# Patient Record
Sex: Female | Born: 1990 | Race: White | Hispanic: No | Marital: Married | Smoking: Never smoker
Health system: Southern US, Community
[De-identification: ages and names within clinical notes are randomized; demographics above are authoritative.]

## PROBLEM LIST (undated history)

## (undated) DIAGNOSIS — T4145XA Adverse effect of unspecified anesthetic, initial encounter: Secondary | ICD-10-CM

## (undated) DIAGNOSIS — N979 Female infertility, unspecified: Secondary | ICD-10-CM

## (undated) DIAGNOSIS — T8859XA Other complications of anesthesia, initial encounter: Secondary | ICD-10-CM

## (undated) DIAGNOSIS — F419 Anxiety disorder, unspecified: Secondary | ICD-10-CM

## (undated) DIAGNOSIS — Z9889 Other specified postprocedural states: Secondary | ICD-10-CM

## (undated) DIAGNOSIS — R112 Nausea with vomiting, unspecified: Secondary | ICD-10-CM

## (undated) HISTORY — DX: Female infertility, unspecified: N97.9

## (undated) HISTORY — PX: TONSILLECTOMY: SUR1361

## (undated) HISTORY — DX: Other complications of anesthesia, initial encounter: T88.59XA

## (undated) HISTORY — DX: Other specified postprocedural states: Z98.890

## (undated) HISTORY — PX: STRABISMUS SURGERY: SHX218

## (undated) HISTORY — PX: BREAST SURGERY: SHX581

## (undated) HISTORY — PX: RHINOPLASTY: SUR1284

## (undated) HISTORY — DX: Nausea with vomiting, unspecified: R11.2

## (undated) HISTORY — DX: Anxiety disorder, unspecified: F41.9

## (undated) HISTORY — DX: Adverse effect of unspecified anesthetic, initial encounter: T41.45XA

---

## 2014-03-22 ENCOUNTER — Other Ambulatory Visit: Payer: Self-pay | Admitting: Obstetrics and Gynecology

## 2014-03-22 DIAGNOSIS — N6320 Unspecified lump in the left breast, unspecified quadrant: Secondary | ICD-10-CM

## 2014-03-22 DIAGNOSIS — Z9882 Breast implant status: Secondary | ICD-10-CM

## 2014-03-27 ENCOUNTER — Ambulatory Visit
Admission: RE | Admit: 2014-03-27 | Discharge: 2014-03-27 | Disposition: A | Discharge status: Home / Self Care | Payer: BC Managed Care – PPO | Source: Ambulatory Visit | Attending: Obstetrics and Gynecology | Admitting: Obstetrics and Gynecology

## 2014-03-27 DIAGNOSIS — N6320 Unspecified lump in the left breast, unspecified quadrant: Secondary | ICD-10-CM

## 2014-03-27 DIAGNOSIS — Z9882 Breast implant status: Secondary | ICD-10-CM

## 2015-01-27 ENCOUNTER — Other Ambulatory Visit: Payer: Self-pay | Admitting: Obstetrics and Gynecology

## 2015-01-27 DIAGNOSIS — N6320 Unspecified lump in the left breast, unspecified quadrant: Secondary | ICD-10-CM

## 2015-01-30 ENCOUNTER — Ambulatory Visit
Admission: RE | Admit: 2015-01-30 | Discharge: 2015-01-30 | Disposition: A | Discharge status: Home / Self Care | Payer: BLUE CROSS/BLUE SHIELD | Source: Ambulatory Visit | Attending: Obstetrics and Gynecology | Admitting: Obstetrics and Gynecology

## 2015-01-30 DIAGNOSIS — N6320 Unspecified lump in the left breast, unspecified quadrant: Secondary | ICD-10-CM

## 2016-12-09 LAB — OB RESULTS CONSOLE GC/CHLAMYDIA
CHLAMYDIA, DNA PROBE: NEGATIVE
Gonorrhea: NEGATIVE

## 2016-12-09 LAB — OB RESULTS CONSOLE HEPATITIS B SURFACE ANTIGEN: Hepatitis B Surface Ag: NEGATIVE

## 2016-12-09 LAB — OB RESULTS CONSOLE RUBELLA ANTIBODY, IGM: Rubella: IMMUNE

## 2016-12-09 LAB — OB RESULTS CONSOLE ABO/RH: RH Type: POSITIVE

## 2016-12-09 LAB — OB RESULTS CONSOLE ANTIBODY SCREEN: ANTIBODY SCREEN: NEGATIVE

## 2016-12-09 LAB — OB RESULTS CONSOLE RPR: RPR: NONREACTIVE

## 2016-12-09 LAB — OB RESULTS CONSOLE HIV ANTIBODY (ROUTINE TESTING): HIV: NONREACTIVE

## 2016-12-28 DIAGNOSIS — Z36 Encounter for antenatal screening for chromosomal anomalies: Secondary | ICD-10-CM | POA: Diagnosis not present

## 2016-12-28 DIAGNOSIS — Z3491 Encounter for supervision of normal pregnancy, unspecified, first trimester: Secondary | ICD-10-CM | POA: Diagnosis not present

## 2016-12-28 DIAGNOSIS — Z13228 Encounter for screening for other metabolic disorders: Secondary | ICD-10-CM | POA: Diagnosis not present

## 2016-12-28 DIAGNOSIS — Z3682 Encounter for antenatal screening for nuchal translucency: Secondary | ICD-10-CM | POA: Diagnosis not present

## 2016-12-29 DIAGNOSIS — F411 Generalized anxiety disorder: Secondary | ICD-10-CM | POA: Diagnosis not present

## 2017-01-17 DIAGNOSIS — Z3480 Encounter for supervision of other normal pregnancy, unspecified trimester: Secondary | ICD-10-CM | POA: Diagnosis not present

## 2017-01-17 DIAGNOSIS — Z3A15 15 weeks gestation of pregnancy: Secondary | ICD-10-CM | POA: Diagnosis not present

## 2017-01-19 DIAGNOSIS — F411 Generalized anxiety disorder: Secondary | ICD-10-CM | POA: Diagnosis not present

## 2017-02-04 DIAGNOSIS — F411 Generalized anxiety disorder: Secondary | ICD-10-CM | POA: Diagnosis not present

## 2017-02-11 DIAGNOSIS — Z363 Encounter for antenatal screening for malformations: Secondary | ICD-10-CM | POA: Diagnosis not present

## 2017-03-02 DIAGNOSIS — F411 Generalized anxiety disorder: Secondary | ICD-10-CM | POA: Diagnosis not present

## 2017-04-12 DIAGNOSIS — F411 Generalized anxiety disorder: Secondary | ICD-10-CM | POA: Diagnosis not present

## 2017-04-13 DIAGNOSIS — Z3480 Encounter for supervision of other normal pregnancy, unspecified trimester: Secondary | ICD-10-CM | POA: Diagnosis not present

## 2017-04-13 DIAGNOSIS — Z23 Encounter for immunization: Secondary | ICD-10-CM | POA: Diagnosis not present

## 2017-04-19 DIAGNOSIS — F411 Generalized anxiety disorder: Secondary | ICD-10-CM | POA: Diagnosis not present

## 2017-04-26 DIAGNOSIS — F411 Generalized anxiety disorder: Secondary | ICD-10-CM | POA: Diagnosis not present

## 2017-05-06 DIAGNOSIS — Z3A30 30 weeks gestation of pregnancy: Secondary | ICD-10-CM | POA: Diagnosis not present

## 2017-05-06 DIAGNOSIS — O358XX0 Maternal care for other (suspected) fetal abnormality and damage, not applicable or unspecified: Secondary | ICD-10-CM | POA: Diagnosis not present

## 2017-05-09 DIAGNOSIS — F411 Generalized anxiety disorder: Secondary | ICD-10-CM | POA: Diagnosis not present

## 2017-06-07 DIAGNOSIS — Z3480 Encounter for supervision of other normal pregnancy, unspecified trimester: Secondary | ICD-10-CM | POA: Diagnosis not present

## 2017-06-07 DIAGNOSIS — Z3A35 35 weeks gestation of pregnancy: Secondary | ICD-10-CM | POA: Diagnosis not present

## 2017-06-13 DIAGNOSIS — Z3A36 36 weeks gestation of pregnancy: Secondary | ICD-10-CM | POA: Diagnosis not present

## 2017-06-13 DIAGNOSIS — O3663X0 Maternal care for excessive fetal growth, third trimester, not applicable or unspecified: Secondary | ICD-10-CM | POA: Diagnosis not present

## 2017-06-23 ENCOUNTER — Telehealth (HOSPITAL_COMMUNITY): Payer: Self-pay | Admitting: *Deleted

## 2017-06-23 ENCOUNTER — Encounter (HOSPITAL_COMMUNITY): Payer: Self-pay | Admitting: *Deleted

## 2017-06-23 LAB — OB RESULTS CONSOLE GBS: GBS: NEGATIVE

## 2017-06-23 NOTE — Telephone Encounter (Signed)
Preadmission screen  

## 2017-06-30 ENCOUNTER — Other Ambulatory Visit (HOSPITAL_COMMUNITY): Admit: 2017-06-30 | Payer: Self-pay

## 2017-06-30 ENCOUNTER — Inpatient Hospital Stay (HOSPITAL_COMMUNITY)
Admission: AD | Admit: 2017-06-30 | Discharge: 2017-06-30 | Disposition: A | Discharge status: Home / Self Care | Payer: BLUE CROSS/BLUE SHIELD | Source: Ambulatory Visit | Attending: Obstetrics and Gynecology | Admitting: Obstetrics and Gynecology

## 2017-06-30 ENCOUNTER — Encounter (HOSPITAL_COMMUNITY): Payer: Self-pay

## 2017-06-30 ENCOUNTER — Encounter (HOSPITAL_COMMUNITY)
Admission: RE | Admit: 2017-06-30 | Discharge: 2017-06-30 | Disposition: A | Discharge status: Home / Self Care | Payer: BLUE CROSS/BLUE SHIELD | Source: Ambulatory Visit | Attending: Obstetrics and Gynecology | Admitting: Obstetrics and Gynecology

## 2017-06-30 DIAGNOSIS — N9971 Accidental puncture and laceration of a genitourinary system organ or structure during a genitourinary system procedure: Secondary | ICD-10-CM | POA: Diagnosis not present

## 2017-06-30 DIAGNOSIS — Y838 Other surgical procedures as the cause of abnormal reaction of the patient, or of later complication, without mention of misadventure at the time of the procedure: Secondary | ICD-10-CM | POA: Diagnosis not present

## 2017-06-30 DIAGNOSIS — Y92234 Operating room of hospital as the place of occurrence of the external cause: Secondary | ICD-10-CM | POA: Diagnosis not present

## 2017-06-30 DIAGNOSIS — O479 False labor, unspecified: Secondary | ICD-10-CM | POA: Diagnosis not present

## 2017-06-30 DIAGNOSIS — Z3A39 39 weeks gestation of pregnancy: Secondary | ICD-10-CM | POA: Diagnosis not present

## 2017-06-30 DIAGNOSIS — Z823 Family history of stroke: Secondary | ICD-10-CM | POA: Diagnosis not present

## 2017-06-30 DIAGNOSIS — Z3A00 Weeks of gestation of pregnancy not specified: Secondary | ICD-10-CM | POA: Insufficient documentation

## 2017-06-30 DIAGNOSIS — O9902 Anemia complicating childbirth: Secondary | ICD-10-CM | POA: Diagnosis not present

## 2017-06-30 DIAGNOSIS — D62 Acute posthemorrhagic anemia: Secondary | ICD-10-CM | POA: Diagnosis not present

## 2017-06-30 DIAGNOSIS — Z8249 Family history of ischemic heart disease and other diseases of the circulatory system: Secondary | ICD-10-CM | POA: Diagnosis not present

## 2017-06-30 NOTE — MAU Note (Signed)
Pt presents to MAU with contractions that started at 5 pm that are occurring every 15 min. Pt denies LOF and vaginal bleeding. + FM

## 2017-06-30 NOTE — MAU Note (Signed)
I have communicated with Dr. Elon SpannerLeger and reviewed vital signs:  Vitals:   06/30/17 2120  BP: 108/62  Pulse: 82  Resp: 18  Temp: 99.3 F (37.4 C)    Vaginal exam:  Dilation: 4.5 Effacement (%): 80 Cervical Position: Middle Station: -3 Presentation: Vertex Exam by:: [place]Brittany Brizeida Mcmurry, RN,   Also reviewed contraction pattern and that non-stress test is reactive.  It has been documented that patient is contracting every 15-20 minutes with no cervical change over since office visit on Tuesday 06/28/2017, not indicating active labor.  Patient denies any other complaints.  Based on this report provider has given order for discharge.  A discharge order and diagnosis entered by a provider.   Labor discharge instructions reviewed with patient.

## 2017-07-03 ENCOUNTER — Inpatient Hospital Stay (HOSPITAL_COMMUNITY): Payer: BLUE CROSS/BLUE SHIELD | Admitting: Anesthesiology

## 2017-07-03 ENCOUNTER — Inpatient Hospital Stay (HOSPITAL_COMMUNITY)
Admission: AD | Admit: 2017-07-03 | Discharge: 2017-07-07 | DRG: 765 | Disposition: A | Discharge status: Home / Self Care | Payer: BLUE CROSS/BLUE SHIELD | Source: Ambulatory Visit | Attending: Obstetrics and Gynecology | Admitting: Obstetrics and Gynecology

## 2017-07-03 ENCOUNTER — Encounter (HOSPITAL_COMMUNITY): Payer: Self-pay

## 2017-07-03 DIAGNOSIS — D62 Acute posthemorrhagic anemia: Secondary | ICD-10-CM | POA: Diagnosis not present

## 2017-07-03 DIAGNOSIS — Z8249 Family history of ischemic heart disease and other diseases of the circulatory system: Secondary | ICD-10-CM | POA: Diagnosis not present

## 2017-07-03 DIAGNOSIS — N9971 Accidental puncture and laceration of a genitourinary system organ or structure during a genitourinary system procedure: Secondary | ICD-10-CM | POA: Diagnosis not present

## 2017-07-03 DIAGNOSIS — Y838 Other surgical procedures as the cause of abnormal reaction of the patient, or of later complication, without mention of misadventure at the time of the procedure: Secondary | ICD-10-CM | POA: Diagnosis not present

## 2017-07-03 DIAGNOSIS — Z3A39 39 weeks gestation of pregnancy: Secondary | ICD-10-CM | POA: Diagnosis not present

## 2017-07-03 DIAGNOSIS — O9902 Anemia complicating childbirth: Secondary | ICD-10-CM | POA: Diagnosis not present

## 2017-07-03 DIAGNOSIS — Z823 Family history of stroke: Secondary | ICD-10-CM | POA: Diagnosis not present

## 2017-07-03 DIAGNOSIS — Z3490 Encounter for supervision of normal pregnancy, unspecified, unspecified trimester: Secondary | ICD-10-CM

## 2017-07-03 DIAGNOSIS — Y92234 Operating room of hospital as the place of occurrence of the external cause: Secondary | ICD-10-CM | POA: Diagnosis not present

## 2017-07-03 DIAGNOSIS — Z3A38 38 weeks gestation of pregnancy: Secondary | ICD-10-CM

## 2017-07-03 LAB — CBC
HEMATOCRIT: 36.4 % (ref 36.0–46.0)
Hemoglobin: 12.5 g/dL (ref 12.0–15.0)
MCH: 31.4 pg (ref 26.0–34.0)
MCHC: 34.3 g/dL (ref 30.0–36.0)
MCV: 91.5 fL (ref 78.0–100.0)
PLATELETS: 149 10*3/uL — AB (ref 150–400)
RBC: 3.98 MIL/uL (ref 3.87–5.11)
RDW: 13.1 % (ref 11.5–15.5)
WBC: 7.2 10*3/uL (ref 4.0–10.5)

## 2017-07-03 MED ORDER — FENTANYL 2.5 MCG/ML BUPIVACAINE 1/10 % EPIDURAL INFUSION (WH - ANES)
INTRAMUSCULAR | Status: AC
  Filled 2017-07-03: qty 100

## 2017-07-03 MED ORDER — FENTANYL CITRATE (PF) 100 MCG/2ML IJ SOLN: 50.0000 ug | INTRAMUSCULAR | Status: DC | PRN

## 2017-07-03 MED ORDER — LIDOCAINE HCL (PF) 1 % IJ SOLN
30.0000 mL | INTRAMUSCULAR | Status: DC | PRN
  Filled 2017-07-03: qty 30

## 2017-07-03 MED ORDER — EPHEDRINE 5 MG/ML INJ: 10.0000 mg | INTRAVENOUS | Status: DC | PRN

## 2017-07-03 MED ORDER — FLEET ENEMA 7-19 GM/118ML RE ENEM: 1.0000 | ENEMA | RECTAL | Status: DC | PRN

## 2017-07-03 MED ORDER — LIDOCAINE HCL (PF) 1 % IJ SOLN
INTRAMUSCULAR | Status: DC | PRN
  Administered 2017-07-03: 3 mL via EPIDURAL
  Administered 2017-07-03: 4 mL via EPIDURAL

## 2017-07-03 MED ORDER — LACTATED RINGERS IV SOLN
INTRAVENOUS | Status: DC
  Administered 2017-07-03 – 2017-07-04 (×5): via INTRAVENOUS

## 2017-07-03 MED ORDER — FENTANYL 2.5 MCG/ML BUPIVACAINE 1/10 % EPIDURAL INFUSION (WH - ANES)
14.0000 mL/h | INTRAMUSCULAR | Status: DC | PRN
  Administered 2017-07-03: 14 mL/h via EPIDURAL

## 2017-07-03 MED ORDER — TERBUTALINE SULFATE 1 MG/ML IJ SOLN: 0.2500 mg | Freq: Once | INTRAMUSCULAR | Status: DC | PRN

## 2017-07-03 MED ORDER — PHENYLEPHRINE 40 MCG/ML (10ML) SYRINGE FOR IV PUSH (FOR BLOOD PRESSURE SUPPORT): 80.0000 ug | PREFILLED_SYRINGE | INTRAVENOUS | Status: DC | PRN

## 2017-07-03 MED ORDER — OXYTOCIN 40 UNITS IN LACTATED RINGERS INFUSION - SIMPLE MED
2.5000 [IU]/h | INTRAVENOUS | Status: DC
  Filled 2017-07-03: qty 1000

## 2017-07-03 MED ORDER — PHENYLEPHRINE 40 MCG/ML (10ML) SYRINGE FOR IV PUSH (FOR BLOOD PRESSURE SUPPORT)
PREFILLED_SYRINGE | INTRAVENOUS | Status: AC
  Filled 2017-07-03: qty 20

## 2017-07-03 MED ORDER — LACTATED RINGERS IV SOLN: 500.0000 mL | Freq: Once | INTRAVENOUS | Status: DC

## 2017-07-03 MED ORDER — OXYCODONE-ACETAMINOPHEN 5-325 MG PO TABS: 2.0000 | ORAL_TABLET | ORAL | Status: DC | PRN

## 2017-07-03 MED ORDER — OXYTOCIN BOLUS FROM INFUSION: 500.0000 mL | Freq: Once | INTRAVENOUS | Status: DC

## 2017-07-03 MED ORDER — OXYCODONE-ACETAMINOPHEN 5-325 MG PO TABS: 1.0000 | ORAL_TABLET | ORAL | Status: DC | PRN

## 2017-07-03 MED ORDER — OXYTOCIN 40 UNITS IN LACTATED RINGERS INFUSION - SIMPLE MED: 1.0000 m[IU]/min | INTRAVENOUS | Status: DC

## 2017-07-03 MED ORDER — ONDANSETRON HCL 4 MG/2ML IJ SOLN: 4.0000 mg | Freq: Four times a day (QID) | INTRAMUSCULAR | Status: DC | PRN

## 2017-07-03 MED ORDER — DIPHENHYDRAMINE HCL 50 MG/ML IJ SOLN: 12.5000 mg | INTRAMUSCULAR | Status: DC | PRN

## 2017-07-03 MED ORDER — ACETAMINOPHEN 325 MG PO TABS: 650.0000 mg | ORAL_TABLET | ORAL | Status: DC | PRN

## 2017-07-03 MED ORDER — LACTATED RINGERS IV SOLN: 500.0000 mL | INTRAVENOUS | Status: DC | PRN

## 2017-07-03 MED ORDER — SOD CITRATE-CITRIC ACID 500-334 MG/5ML PO SOLN
30.0000 mL | ORAL | Status: DC | PRN
Start: 2017-07-03 — End: 2017-07-04
  Administered 2017-07-04: 30 mL via ORAL
  Filled 2017-07-03: qty 15

## 2017-07-03 NOTE — Anesthesia Preprocedure Evaluation (Deleted)
Anesthesia Evaluation  Patient identified by MRN, date of birth, ID band Patient awake    Reviewed: Allergy & Precautions, Patient's Chart, lab work & pertinent test results  History of Anesthesia Complications (+) PONV and history of anesthetic complications  Airway Mallampati: II  TM Distance: >3 FB Neck ROM: Full    Dental no notable dental hx. (+) Teeth Intact   Pulmonary neg pulmonary ROS,    Pulmonary exam normal breath sounds clear to auscultation       Cardiovascular negative cardio ROS Normal cardiovascular exam Rhythm:Regular Rate:Normal     Neuro/Psych Anxiety negative neurological ROS     GI/Hepatic Neg liver ROS, GERD  ,  Endo/Other  negative endocrine ROS  Renal/GU negative Renal ROS  negative genitourinary   Musculoskeletal negative musculoskeletal ROS (+)   Abdominal   Peds  Hematology negative hematology ROS (+)   Anesthesia Other Findings   Reproductive/Obstetrics (+) Pregnancy                             Anesthesia Physical Anesthesia Plan  ASA: II  Anesthesia Plan: Epidural   Post-op Pain Management:    Induction:   PONV Risk Score and Plan:   Airway Management Planned: Natural Airway  Additional Equipment:   Intra-op Plan:   Post-operative Plan:   Informed Consent: I have reviewed the patients History and Physical, chart, labs and discussed the procedure including the risks, benefits and alternatives for the proposed anesthesia with the patient or authorized representative who has indicated his/her understanding and acceptance.     Plan Discussed with: Anesthesiologist  Anesthesia Plan Comments:         Anesthesia Quick Evaluation

## 2017-07-03 NOTE — Progress Notes (Addendum)
G1 @ 39.0. Presents to triage for r/o labor. EFM applies and VS done by RN tech.   SVE: 7/90/-2. Labs and IV done. Pt to birthing via bed.   2159: Birthing suite called to scan LR for MAU nurse due to urgency of pt needing get to  to birthing suite and desire for epidural. .

## 2017-07-03 NOTE — Anesthesia Procedure Notes (Signed)
Epidural Patient location during procedure: OB Start time: 07/03/2017 10:27 PM  Staffing Anesthesiologist: Mal AmabileFOSTER, Tyler Robidoux Performed: anesthesiologist   Preanesthetic Checklist Completed: patient identified, site marked, surgical consent, pre-op evaluation, timeout performed, IV checked, risks and benefits discussed and monitors and equipment checked  Epidural Patient position: sitting Prep: site prepped and draped and DuraPrep Patient monitoring: continuous pulse ox and blood pressure Approach: midline Location: L3-L4 Injection technique: LOR air  Needle:  Needle type: Tuohy  Needle gauge: 17 G Needle length: 9 cm and 9 Needle insertion depth: 5 cm cm Catheter type: closed end flexible Catheter size: 19 Gauge Catheter at skin depth: 10 cm Test dose: negative and Other  Assessment Events: blood not aspirated, injection not painful, no injection resistance, negative IV test and no paresthesia  Additional Notes Patient identified. Risks and benefits discussed including failed block, incomplete  Pain control, post dural puncture headache, nerve damage, paralysis, blood pressure Changes, nausea, vomiting, reactions to medications-both toxic and allergic and post Partum back pain. All questions were answered. Patient expressed understanding and wished to proceed. Sterile technique was used throughout procedure. Epidural site was Dressed with sterile barrier dressing. No paresthesias, signs of intravascular injection Or signs of intrathecal spread were encountered.  Patient was more comfortable after the epidural was dosed. Please see RN's note for documentation of vital signs and FHR which are stable.

## 2017-07-03 NOTE — H&P (Signed)
Allison Mcdowell is a 26 y.o. female presenting for active labor.  Pregnancy uncomplicated.  GBS-. OB History    Gravida Para Term Preterm AB Living   1             SAB TAB Ectopic Multiple Live Births                 Past Medical History:  Diagnosis Date  . Anxiety   . Complication of anesthesia   . Infertility, female   . PONV (postoperative nausea and vomiting)    Past Surgical History:  Procedure Laterality Date  . BREAST SURGERY     aug  . RHINOPLASTY    . STRABISMUS SURGERY    . TONSILLECTOMY     Family History: family history includes Heart disease in her maternal grandfather; Hypertension in her father, paternal grandfather, and paternal grandmother; Stroke in her paternal grandfather; Thyroid disease in her maternal aunt, maternal grandmother, and mother. Social History:  reports that she has never smoked. She has never used smokeless tobacco. She reports that she does not drink alcohol or use drugs.     Maternal Diabetes: No Genetic Screening: Normal Maternal Ultrasounds/Referrals: Normal Fetal Ultrasounds or other Referrals:  None Maternal Substance Abuse:  No Significant Maternal Medications:  None Significant Maternal Lab Results:  None Other Comments:  None  ROS History Dilation: 7 Effacement (%): 100 Station: -2 Exam by:: Dr. Rana SnareLowe Blood pressure (!) 95/59, pulse 78, temperature 98.8 F (37.1 C), resp. rate 18, height 5\' 3"  (1.6 m), weight 147 lb (66.7 kg), SpO2 100 %. Exam Physical Exam  Prenatal labs: ABO, Rh: --/--/B POS (09/09 2130) Antibody: NEG (09/09 2130) Rubella: Immune (02/15 0000) RPR: Nonreactive (02/15 0000)  HBsAg: Negative (02/15 0000)  HIV: Non-reactive (02/15 0000)  GBS: Negative (08/30 0000)   Assessment/Plan: IUP at term Active labor AROM and anticipate SVD   Allison Mcdowell C 07/03/2017, 11:31 PM

## 2017-07-03 NOTE — Anesthesia Preprocedure Evaluation (Addendum)
Anesthesia Evaluation  Patient identified by MRN, date of birth, ID band Patient awake    Reviewed: Allergy & Precautions, Patient's Chart, lab work & pertinent test results  History of Anesthesia Complications (+) PONV and history of anesthetic complications  Airway Mallampati: II  TM Distance: >3 FB Neck ROM: Full    Dental no notable dental hx. (+) Teeth Intact   Pulmonary neg pulmonary ROS,    Pulmonary exam normal breath sounds clear to auscultation       Cardiovascular negative cardio ROS Normal cardiovascular exam Rhythm:Regular Rate:Normal     Neuro/Psych Anxiety negative neurological ROS     GI/Hepatic Neg liver ROS, GERD  ,  Endo/Other  negative endocrine ROS  Renal/GU negative Renal ROS  negative genitourinary   Musculoskeletal negative musculoskeletal ROS (+)   Abdominal   Peds  Hematology negative hematology ROS (+)   Anesthesia Other Findings   Reproductive/Obstetrics (+) Pregnancy Hx/o infertility                             Anesthesia Physical Anesthesia Plan  ASA: II and emergent  Anesthesia Plan: Epidural   Post-op Pain Management:    Induction:   PONV Risk Score and Plan: 4 or greater and Ondansetron, Dexamethasone, Midazolam, Scopolamine patch - Pre-op and Propofol infusion  Airway Management Planned: Natural Airway  Additional Equipment:   Intra-op Plan:   Post-operative Plan:   Informed Consent: I have reviewed the patients History and Physical, chart, labs and discussed the procedure including the risks, benefits and alternatives for the proposed anesthesia with the patient or authorized representative who has indicated his/her understanding and acceptance.   Dental advisory given  Plan Discussed with: Anesthesiologist, CRNA and Surgeon  Anesthesia Plan Comments: (C/Section for failure to descend. Will use Epidural.)       Anesthesia Quick  Evaluation

## 2017-07-04 ENCOUNTER — Encounter (HOSPITAL_COMMUNITY)
Admission: AD | Disposition: A | Discharge status: Home / Self Care | Payer: Self-pay | Source: Ambulatory Visit | Attending: Obstetrics and Gynecology

## 2017-07-04 ENCOUNTER — Inpatient Hospital Stay (HOSPITAL_COMMUNITY): Admission: RE | Admit: 2017-07-04 | Payer: BLUE CROSS/BLUE SHIELD | Source: Ambulatory Visit

## 2017-07-04 ENCOUNTER — Encounter (HOSPITAL_COMMUNITY): Payer: Self-pay | Admitting: Anesthesiology

## 2017-07-04 LAB — CBC
HCT: 25.6 % — ABNORMAL LOW (ref 36.0–46.0)
HCT: 21.3 % — ABNORMAL LOW (ref 36.0–46.0)
HEMOGLOBIN: 9 g/dL — AB (ref 12.0–15.0)
Hemoglobin: 7.4 g/dL — ABNORMAL LOW (ref 12.0–15.0)
MCH: 32.6 pg (ref 26.0–34.0)
MCH: 31.9 pg (ref 26.0–34.0)
MCHC: 35.2 g/dL (ref 30.0–36.0)
MCHC: 34.7 g/dL (ref 30.0–36.0)
MCV: 92.8 fL (ref 78.0–100.0)
MCV: 91.8 fL (ref 78.0–100.0)
PLATELETS: 110 10*3/uL — AB (ref 150–400)
Platelets: 133 10*3/uL — ABNORMAL LOW (ref 150–400)
RBC: 2.76 MIL/uL — ABNORMAL LOW (ref 3.87–5.11)
RBC: 2.32 MIL/uL — ABNORMAL LOW (ref 3.87–5.11)
RDW: 13.3 % (ref 11.5–15.5)
RDW: 13.4 % (ref 11.5–15.5)
WBC: 16.7 10*3/uL — ABNORMAL HIGH (ref 4.0–10.5)
WBC: 10 10*3/uL (ref 4.0–10.5)

## 2017-07-04 LAB — ABO/RH: ABO/RH(D): B POS

## 2017-07-04 LAB — RPR: RPR Ser Ql: NONREACTIVE

## 2017-07-04 SURGERY — Surgical Case
Anesthesia: Epidural

## 2017-07-04 MED ORDER — SIMETHICONE 80 MG PO CHEW
80.0000 mg | CHEWABLE_TABLET | ORAL | Status: DC
  Administered 2017-07-04 – 2017-07-06 (×3): 80 mg via ORAL
  Filled 2017-07-04 (×3): qty 1

## 2017-07-04 MED ORDER — CITALOPRAM HYDROBROMIDE 10 MG PO TABS
10.0000 mg | ORAL_TABLET | Freq: Every day | ORAL | Status: DC
  Administered 2017-07-04 – 2017-07-07 (×4): 10 mg via ORAL
  Filled 2017-07-04 (×4): qty 1

## 2017-07-04 MED ORDER — FENTANYL CITRATE (PF) 100 MCG/2ML IJ SOLN: 25.0000 ug | INTRAMUSCULAR | Status: DC | PRN

## 2017-07-04 MED ORDER — MORPHINE SULFATE (PF) 0.5 MG/ML IJ SOLN
INTRAMUSCULAR | Status: AC
  Filled 2017-07-04: qty 10

## 2017-07-04 MED ORDER — DIPHENHYDRAMINE HCL 50 MG/ML IJ SOLN: 12.5000 mg | INTRAMUSCULAR | Status: DC | PRN

## 2017-07-04 MED ORDER — KETOROLAC TROMETHAMINE 30 MG/ML IJ SOLN: 30.0000 mg | Freq: Four times a day (QID) | INTRAMUSCULAR | Status: AC | PRN

## 2017-07-04 MED ORDER — SENNOSIDES-DOCUSATE SODIUM 8.6-50 MG PO TABS
2.0000 | ORAL_TABLET | ORAL | Status: DC
  Administered 2017-07-04 – 2017-07-06 (×3): 2 via ORAL
  Filled 2017-07-04 (×3): qty 2

## 2017-07-04 MED ORDER — DEXAMETHASONE SODIUM PHOSPHATE 4 MG/ML IJ SOLN
INTRAMUSCULAR | Status: DC | PRN
  Administered 2017-07-04: 4 mg via INTRAVENOUS

## 2017-07-04 MED ORDER — MORPHINE SULFATE (PF) 0.5 MG/ML IJ SOLN
INTRAMUSCULAR | Status: DC | PRN
  Administered 2017-07-04: 1 mg via EPIDURAL
  Administered 2017-07-04: 4 mg via EPIDURAL

## 2017-07-04 MED ORDER — MENTHOL 3 MG MT LOZG: 1.0000 | LOZENGE | OROMUCOSAL | Status: DC | PRN

## 2017-07-04 MED ORDER — CEFOTETAN DISODIUM-DEXTROSE 2-2.08 GM-% IV SOLR
2.0000 g | Freq: Once | INTRAVENOUS | Status: DC
Start: 2017-07-04 — End: 2017-07-04
  Filled 2017-07-04: qty 50

## 2017-07-04 MED ORDER — SODIUM CHLORIDE 0.9 % IR SOLN
Status: DC | PRN
  Administered 2017-07-04: 600 mL

## 2017-07-04 MED ORDER — COCONUT OIL OIL: 1.0000 "application " | TOPICAL_OIL | Status: DC | PRN

## 2017-07-04 MED ORDER — OXYTOCIN 10 UNIT/ML IJ SOLN
INTRAVENOUS | Status: DC | PRN
  Administered 2017-07-04: 40 [IU] via INTRAVENOUS

## 2017-07-04 MED ORDER — FENTANYL CITRATE (PF) 100 MCG/2ML IJ SOLN
INTRAMUSCULAR | Status: DC | PRN
  Administered 2017-07-04 (×2): 50 ug via INTRAVENOUS

## 2017-07-04 MED ORDER — DIBUCAINE 1 % RE OINT: 1.0000 "application " | TOPICAL_OINTMENT | RECTAL | Status: DC | PRN

## 2017-07-04 MED ORDER — SODIUM CHLORIDE 0.9% FLUSH: 3.0000 mL | INTRAVENOUS | Status: DC | PRN

## 2017-07-04 MED ORDER — EPHEDRINE SULFATE 50 MG/ML IJ SOLN
INTRAMUSCULAR | Status: DC | PRN
  Administered 2017-07-04 (×2): 5 mg via INTRAVENOUS

## 2017-07-04 MED ORDER — PHENYLEPHRINE 40 MCG/ML (10ML) SYRINGE FOR IV PUSH (FOR BLOOD PRESSURE SUPPORT)
PREFILLED_SYRINGE | INTRAVENOUS | Status: AC
  Filled 2017-07-04: qty 30

## 2017-07-04 MED ORDER — ZOLPIDEM TARTRATE 5 MG PO TABS
5.0000 mg | ORAL_TABLET | Freq: Every evening | ORAL | Status: DC | PRN
Start: 2017-07-04 — End: 2017-07-07

## 2017-07-04 MED ORDER — ONDANSETRON HCL 4 MG/2ML IJ SOLN: 4.0000 mg | Freq: Three times a day (TID) | INTRAMUSCULAR | Status: DC | PRN

## 2017-07-04 MED ORDER — WITCH HAZEL-GLYCERIN EX PADS: 1.0000 "application " | MEDICATED_PAD | CUTANEOUS | Status: DC | PRN

## 2017-07-04 MED ORDER — NALBUPHINE HCL 10 MG/ML IJ SOLN: 5.0000 mg | INTRAMUSCULAR | Status: DC | PRN

## 2017-07-04 MED ORDER — SCOPOLAMINE 1 MG/3DAYS TD PT72
MEDICATED_PATCH | TRANSDERMAL | Status: AC
  Filled 2017-07-04: qty 1

## 2017-07-04 MED ORDER — EPHEDRINE 5 MG/ML INJ
INTRAVENOUS | Status: AC
  Filled 2017-07-04: qty 10

## 2017-07-04 MED ORDER — METOCLOPRAMIDE HCL 5 MG/ML IJ SOLN: 10.0000 mg | Freq: Once | INTRAMUSCULAR | Status: DC | PRN

## 2017-07-04 MED ORDER — SCOPOLAMINE 1 MG/3DAYS TD PT72
MEDICATED_PATCH | TRANSDERMAL | Status: DC | PRN
  Administered 2017-07-04: 1 via TRANSDERMAL

## 2017-07-04 MED ORDER — FERROUS SULFATE 325 (65 FE) MG PO TABS
325.0000 mg | ORAL_TABLET | Freq: Two times a day (BID) | ORAL | Status: DC
  Administered 2017-07-04 – 2017-07-07 (×6): 325 mg via ORAL
  Filled 2017-07-04 (×11): qty 1

## 2017-07-04 MED ORDER — DEXTROSE 5 % IV SOLN
INTRAVENOUS | Status: DC | PRN
  Administered 2017-07-04: 2 g via INTRAVENOUS

## 2017-07-04 MED ORDER — ONDANSETRON HCL 4 MG/2ML IJ SOLN
INTRAMUSCULAR | Status: AC
  Filled 2017-07-04: qty 2

## 2017-07-04 MED ORDER — MIDAZOLAM HCL 2 MG/2ML IJ SOLN
INTRAMUSCULAR | Status: AC
  Filled 2017-07-04: qty 2

## 2017-07-04 MED ORDER — OXYTOCIN 10 UNIT/ML IJ SOLN
INTRAMUSCULAR | Status: AC
  Filled 2017-07-04: qty 4

## 2017-07-04 MED ORDER — DEXAMETHASONE SODIUM PHOSPHATE 4 MG/ML IJ SOLN
INTRAMUSCULAR | Status: AC
  Filled 2017-07-04: qty 1

## 2017-07-04 MED ORDER — IBUPROFEN 600 MG PO TABS
600.0000 mg | ORAL_TABLET | Freq: Four times a day (QID) | ORAL | Status: DC
  Administered 2017-07-04 – 2017-07-07 (×12): 600 mg via ORAL
  Filled 2017-07-04 (×11): qty 1

## 2017-07-04 MED ORDER — OXYCODONE HCL 5 MG PO TABS: 10.0000 mg | ORAL_TABLET | ORAL | Status: DC | PRN

## 2017-07-04 MED ORDER — DIPHENHYDRAMINE HCL 50 MG/ML IJ SOLN
INTRAMUSCULAR | Status: DC | PRN
  Administered 2017-07-04: 25 mg via INTRAVENOUS

## 2017-07-04 MED ORDER — ONDANSETRON HCL 4 MG/2ML IJ SOLN
INTRAMUSCULAR | Status: DC | PRN
  Administered 2017-07-04: 4 mg via INTRAVENOUS

## 2017-07-04 MED ORDER — SIMETHICONE 80 MG PO CHEW
80.0000 mg | CHEWABLE_TABLET | Freq: Three times a day (TID) | ORAL | Status: DC
  Administered 2017-07-04 – 2017-07-07 (×8): 80 mg via ORAL
  Filled 2017-07-04 (×8): qty 1

## 2017-07-04 MED ORDER — DEXTROSE 5 % IV SOLN
1.0000 ug/kg/h | INTRAVENOUS | Status: DC | PRN
  Filled 2017-07-04: qty 2

## 2017-07-04 MED ORDER — ALBUMIN HUMAN 5 % IV SOLN
12.5000 g | Freq: Once | INTRAVENOUS | Status: AC
  Administered 2017-07-04: 12.5 g via INTRAVENOUS

## 2017-07-04 MED ORDER — STERILE WATER FOR IRRIGATION IR SOLN
Status: DC | PRN
  Administered 2017-07-04: 1000 mL

## 2017-07-04 MED ORDER — NALBUPHINE HCL 10 MG/ML IJ SOLN: 5.0000 mg | Freq: Once | INTRAMUSCULAR | Status: DC | PRN

## 2017-07-04 MED ORDER — SIMETHICONE 80 MG PO CHEW: 80.0000 mg | CHEWABLE_TABLET | ORAL | Status: DC | PRN

## 2017-07-04 MED ORDER — SODIUM CHLORIDE 0.9% FLUSH
INTRAVENOUS | Status: AC
  Filled 2017-07-04: qty 3

## 2017-07-04 MED ORDER — MEPERIDINE HCL 25 MG/ML IJ SOLN
INTRAMUSCULAR | Status: AC
  Filled 2017-07-04: qty 1

## 2017-07-04 MED ORDER — SODIUM BICARBONATE 8.4 % IV SOLN
INTRAVENOUS | Status: DC | PRN
  Administered 2017-07-04 (×3): 5 mL via EPIDURAL

## 2017-07-04 MED ORDER — MEPERIDINE HCL 25 MG/ML IJ SOLN
INTRAMUSCULAR | Status: DC | PRN
  Administered 2017-07-04 (×2): 12.5 mg via INTRAVENOUS

## 2017-07-04 MED ORDER — METOCLOPRAMIDE HCL 5 MG/ML IJ SOLN
INTRAMUSCULAR | Status: AC
  Filled 2017-07-04: qty 2

## 2017-07-04 MED ORDER — MIDAZOLAM HCL 2 MG/2ML IJ SOLN
INTRAMUSCULAR | Status: DC | PRN
  Administered 2017-07-04: 2 mg via INTRAVENOUS

## 2017-07-04 MED ORDER — PRENATAL MULTIVITAMIN CH
1.0000 | ORAL_TABLET | Freq: Every day | ORAL | Status: DC
  Administered 2017-07-05: 1 via ORAL
  Filled 2017-07-04: qty 1

## 2017-07-04 MED ORDER — OXYCODONE HCL 5 MG PO TABS
5.0000 mg | ORAL_TABLET | ORAL | Status: DC | PRN
  Administered 2017-07-05 – 2017-07-07 (×4): 5 mg via ORAL
  Filled 2017-07-04 (×5): qty 1

## 2017-07-04 MED ORDER — DIPHENHYDRAMINE HCL 50 MG/ML IJ SOLN
INTRAMUSCULAR | Status: AC
  Filled 2017-07-04: qty 1

## 2017-07-04 MED ORDER — METOCLOPRAMIDE HCL 5 MG/ML IJ SOLN
INTRAMUSCULAR | Status: DC | PRN
  Administered 2017-07-04: 10 mg via INTRAVENOUS

## 2017-07-04 MED ORDER — OXYTOCIN 40 UNITS IN LACTATED RINGERS INFUSION - SIMPLE MED: 2.5000 [IU]/h | INTRAVENOUS | Status: AC

## 2017-07-04 MED ORDER — DIPHENHYDRAMINE HCL 25 MG PO CAPS
25.0000 mg | ORAL_CAPSULE | ORAL | Status: DC | PRN
Start: 2017-07-04 — End: 2017-07-07

## 2017-07-04 MED ORDER — PHENYLEPHRINE HCL 10 MG/ML IJ SOLN
INTRAMUSCULAR | Status: DC | PRN
  Administered 2017-07-04: 80 ug via INTRAVENOUS
  Administered 2017-07-04 (×4): 120 ug via INTRAVENOUS
  Administered 2017-07-04: 80 ug via INTRAVENOUS
  Administered 2017-07-04 (×2): 120 ug via INTRAVENOUS
  Administered 2017-07-04: 80 ug via INTRAVENOUS
  Administered 2017-07-04 (×4): 120 ug via INTRAVENOUS
  Administered 2017-07-04 (×2): 200 ug via INTRAVENOUS

## 2017-07-04 MED ORDER — MEPERIDINE HCL 25 MG/ML IJ SOLN
6.2500 mg | INTRAMUSCULAR | Status: DC | PRN
  Administered 2017-07-04: 12.5 mg via INTRAVENOUS

## 2017-07-04 MED ORDER — FENTANYL CITRATE (PF) 100 MCG/2ML IJ SOLN
INTRAMUSCULAR | Status: AC
  Filled 2017-07-04: qty 2

## 2017-07-04 MED ORDER — ACETAMINOPHEN 325 MG PO TABS
650.0000 mg | ORAL_TABLET | ORAL | Status: DC | PRN
  Administered 2017-07-04 – 2017-07-06 (×5): 650 mg via ORAL
  Filled 2017-07-04 (×5): qty 2

## 2017-07-04 MED ORDER — DIPHENHYDRAMINE HCL 25 MG PO CAPS: 25.0000 mg | ORAL_CAPSULE | Freq: Four times a day (QID) | ORAL | Status: DC | PRN

## 2017-07-04 MED ORDER — TETANUS-DIPHTH-ACELL PERTUSSIS 5-2.5-18.5 LF-MCG/0.5 IM SUSP: 0.5000 mL | Freq: Once | INTRAMUSCULAR | Status: DC

## 2017-07-04 MED ORDER — MEASLES, MUMPS & RUBELLA VAC ~~LOC~~ INJ
0.5000 mL | INJECTION | Freq: Once | SUBCUTANEOUS | Status: DC
  Filled 2017-07-04: qty 0.5

## 2017-07-04 MED ORDER — LACTATED RINGERS IV SOLN
INTRAVENOUS | Status: DC
  Administered 2017-07-04 – 2017-07-05 (×3): via INTRAVENOUS

## 2017-07-04 MED ORDER — NALOXONE HCL 0.4 MG/ML IJ SOLN: 0.4000 mg | INTRAMUSCULAR | Status: DC | PRN

## 2017-07-04 SURGICAL SUPPLY — 30 items
CHLORAPREP W/TINT 26ML (MISCELLANEOUS) ×3 IMPLANT
CLAMP CORD UMBIL (MISCELLANEOUS) IMPLANT
CLOTH BEACON ORANGE TIMEOUT ST (SAFETY) ×3 IMPLANT
DRSG OPSITE POSTOP 4X10 (GAUZE/BANDAGES/DRESSINGS) ×3 IMPLANT
ELECT REM PT RETURN 9FT ADLT (ELECTROSURGICAL) ×3
ELECTRODE REM PT RTRN 9FT ADLT (ELECTROSURGICAL) ×1 IMPLANT
EXTRACTOR VACUUM M CUP 4 TUBE (SUCTIONS) IMPLANT
EXTRACTOR VACUUM M CUP 4' TUBE (SUCTIONS)
GLOVE BIOGEL PI IND STRL 7.0 (GLOVE) ×1 IMPLANT
GLOVE BIOGEL PI INDICATOR 7.0 (GLOVE) ×2
GLOVE SURG ORTHO 8.0 STRL STRW (GLOVE) ×3 IMPLANT
GOWN STRL REUS W/TWL LRG LVL3 (GOWN DISPOSABLE) ×6 IMPLANT
HEMOSTAT ARISTA ABSORB 3G PWDR (MISCELLANEOUS) ×3 IMPLANT
KIT ABG SYR 3ML LUER SLIP (SYRINGE) ×3 IMPLANT
NEEDLE HYPO 25X5/8 SAFETYGLIDE (NEEDLE) ×3 IMPLANT
NS IRRIG 1000ML POUR BTL (IV SOLUTION) ×3 IMPLANT
PACK C SECTION WH (CUSTOM PROCEDURE TRAY) ×3 IMPLANT
PAD OB MATERNITY 4.3X12.25 (PERSONAL CARE ITEMS) ×3 IMPLANT
PENCIL SMOKE EVAC W/HOLSTER (ELECTROSURGICAL) ×3 IMPLANT
SPONGE LAP 18X18 X RAY DECT (DISPOSABLE) ×15 IMPLANT
SUT CHROMIC 2 0 CT 1 (SUTURE) ×3 IMPLANT
SUT MNCRL 0 VIOLET CTX 36 (SUTURE) ×4 IMPLANT
SUT MON AB 4-0 PS1 27 (SUTURE) ×3 IMPLANT
SUT MONOCRYL 0 CTX 36 (SUTURE) ×8
SUT PDS AB 1 CT  36 (SUTURE)
SUT PDS AB 1 CT 36 (SUTURE) IMPLANT
SUT VIC AB 1 CTX 36 (SUTURE)
SUT VIC AB 1 CTX36XBRD ANBCTRL (SUTURE) IMPLANT
TOWEL OR 17X24 6PK STRL BLUE (TOWEL DISPOSABLE) ×3 IMPLANT
TRAY FOLEY BAG SILVER LF 14FR (SET/KITS/TRAYS/PACK) ×3 IMPLANT

## 2017-07-04 NOTE — Transfer of Care (Signed)
Immediate Anesthesia Transfer of Care Note  Patient: Allison Mcdowell  Procedure(s) Performed: * No procedures listed *  Patient Location: PACU  Anesthesia Type:Epidural  Level of Consciousness: awake, alert  and oriented  Airway & Oxygen Therapy: Patient Spontanous Breathing and Patient connected to nasal cannula oxygen  Post-op Assessment: Report given to RN and Post -op Vital signs reviewed and unstable, Anesthesiologist notified  Post vital signs: Reviewed and stable  Last Vitals:  Vitals:   07/04/17 0644 07/04/17 0645  BP: 121/71   Pulse: 64 64  Resp: 17 16  Temp:    SpO2: 100% 100%    Last Pain:  Vitals:   07/04/17 0603  TempSrc: Oral  PainSc: 0-No pain      Patients Stated Pain Goal: 5 (07/03/17 2118)  Complications: No apparent anesthesia complications

## 2017-07-04 NOTE — Anesthesia Postprocedure Evaluation (Signed)
Anesthesia Post Note  Patient: Allison Mcdowell  Procedure(s) Performed: Procedure(s) (LRB): CESAREAN SECTION (N/A)     Patient location during evaluation: Mother Baby Anesthesia Type: Spinal Level of consciousness: awake and alert and oriented Pain management: satisfactory to patient Vital Signs Assessment: post-procedure vital signs reviewed and stable Respiratory status: respiratory function stable and spontaneous breathing Cardiovascular status: blood pressure returned to baseline Postop Assessment: no headache, no backache, spinal receding, patient able to bend at knees and adequate PO intake Anesthetic complications: no    Last Vitals:  Vitals:   07/04/17 1145 07/04/17 1600  BP: (!) 103/45 (!) 97/53  Pulse: 85 70  Resp: 16 18  Temp: 37.4 C 36.9 C  SpO2: 95% 95%    Last Pain:  Vitals:   07/04/17 1744  TempSrc:   PainSc: 3    Pain Goal: Patients Stated Pain Goal: 3 (07/04/17 1744)               Karleen DolphinFUSSELL,Marien Manship

## 2017-07-04 NOTE — Progress Notes (Signed)
Patient ID: Allison Mcdowell, female   DOB: [birth year], 26 y.o.   MRN: [id number] Pt pushing now 3.5 hours with no further descent of fetal vertex for the last 1 hour.  LOA position and 0 station with push.  FHR 135 with U shaped variables.  With rest, Cat 1  Plan primary LSTCS for arrest of descent Risks and benefits of C/S were discussed.  All questions were answered and informed consent was obtained.  Plan to proceed with low segment transverse Cesarean Section.

## 2017-07-04 NOTE — Progress Notes (Signed)
Epidural catheter still in place. Dr. Jean RosenthalJackson called about when would be appropriate to remove. Reported to doctor labs that were drawn at 1200 and bleeding has been stable. Dr. Jean RosenthalJackson stated she would not remove until another platelet level was drawn and advised to call OB to decide if another platelet level needed to be drawn tonight or wait until morning scheduled lab draw. Dr. Langston MaskerMorris called and stated that epidural catheter can be left in until lab draw in the morning and results are back. Earl Galasborne, Linda HedgesStefanie [place]

## 2017-07-04 NOTE — Plan of Care (Signed)
Problem: Education: Goal: Knowledge of  General Education information/materials will improve Patient arousable when entering room; however very groggy and tired. Patient's mother in room. Reviewed call bell and plan of care with patient's mother. Plan to review mother baby paperwork once patient more alert and feeling better.

## 2017-07-04 NOTE — Op Note (Signed)
Cesarean Section Procedure Note  Pre-operative Diagnosis: IUP at 39 weeks, Arrest of descent  Post-operative Diagnosis: same  Surgeon: Turner DanielsLOWE,Guerry Covington C   Assistants: none   Anesthesia: epidural  Procedure:  Low Segment Transverse cesarean section  Procedure Details  The patient was seen in the Holding Room. The risks, benefits, complications, treatment options, and expected outcomes were discussed with the patient.  The patient concurred with the proposed plan, giving informed consent.  The site of surgery properly noted/marked.. A Time Out was held and the above information confirmed.  After induction of anesthesia, the patient was draped and prepped in the usual sterile manner. A Pfannenstiel incision was made and carried down through the subcutaneous tissue to the fascia. Fascial incision was made and extended transversely. The fascia was separated from the underlying rectus tissue superiorly and inferiorly. The peritoneum was identified and entered. Peritoneal incision was extended longitudinally. The utero-vesical peritoneal reflection was incised transversely and the bladder flap was bluntly freed from the lower uterine segment. A low transverse uterine incision was made. Delivered from vertex presentation with difficulty unengaging the head from pelvis was a baby with Apgar scores of 7 at one minute and 9 at five minutes. After the umbilical cord was clamped and cut cord blood was obtained for evaluation. The placenta was removed intact and appeared normal. The uterine outline, tubes and ovaries appeared normal. A cervical laceration was noted along the left side and the apex grasped and ligated to achieve hemostasis and then running suture of 0 monocryl used to approximate the laceration.  Bleeding from bladder flap was noted and 2-0 chromic used in figure of 8 to achieve hemostasis.  The uterine incision was closed with running locked sutures of 0 monocryl and imbricated with 0 monocryl.  Hemostasis was observed. Lavage was carried out until clear. The peritoneum was then closed with 0 monocryl and rectus muscles plicated in the midline.  After hemostasis was assured, the fascia was then reapproximated with running sutures of 0 Vicryl. Irrigation was applied and after adequate hemostasis was assured, the skin was reapproximated with subcutaneous sutures using 4-0 monocryl.  Instrument, sponge, and needle counts were correct prior the abdominal closure and at the conclusion of the case. The patient received 2 grams cefotetan preoperatively.  H/H to be checked in PACU  Findings: Viable female  Estimated Blood Loss:  1675 by scale         Specimens: Placenta was sent to labor and delivery         Complications:  Extension of low segment myotomy incision to left wall of cervix

## 2017-07-04 NOTE — Consult Note (Signed)
Neonatology Note:   Attendance at C-section:    I was asked by Dr. Lowe to attend this primary C/S at term due to FTP. The mother is a G1P0 B pos, GBS neg with an uncomplicated pregnancy. ROM 5 hours prior to delivery, fluid clear. Infant with good tone, HR, but not crying vigorously. Given stimulation during delayed cord clamping, with onset of crying. Needed only minimal bulb suctioning. Cried well with stimulation and maintained normal HR and tone throughout. Ap 7/9. Lungs clear to ausc in DR. To CN to care of Pediatrician.   Allison Mcdowell C. Marnae Madani, MD 

## 2017-07-04 NOTE — Progress Notes (Signed)
Subjective: Postpartum Day 0: Cesarean Delivery Patient reports tolerating PO.    Objective: Vital signs in last 24 hours: Temp:  [97.9 F (36.6 C)-99.5 F (37.5 C)] 99.5 F (37.5 C) (09/10 0753) Pulse Rate:  [64-118] 88 (09/10 0830) Resp:  [8-27] 17 (09/10 0830) BP: (63-121)/(41-93) 103/62 (09/10 0830) SpO2:  [75 %-100 %] 95 % (09/10 0830) Weight:  [147 lb (66.7 kg)] 147 lb (66.7 kg) (09/09 2100)  Physical Exam:  General: alert, cooperative and appears stated age Lochia: appropriate Uterine Fundus: firm Incision: pressure dressing and honeycomb dressing were removed as blood was leaking through.  Incision clean, dry and intact; no active bleeding or suspicious for hematoma.  Homey comb and pressure dressing reapplied.  DVT Evaluation: No evidence of DVT seen on physical exam. Negative Homan's sign. No cords or calf tenderness.   Recent Labs  07/03/17 2130 07/04/17 0611  HGB 12.5 9.0*  HCT 36.4 25.6*    Assessment/Plan: Status post Cesarean section. Doing well postoperatively.  Continue current care. Acute blood loss anemia-recheck hemoglobin at noon.  Start ferrous sulfate.  Imani Fiebelkorn 07/04/2017, 8:35 AM

## 2017-07-04 NOTE — Plan of Care (Signed)
Problem: Urinary Elimination: Goal: Ability to reestablish a normal urinary elimination pattern will improve Urine dark pink tinged with small amount of sediment in foley tubing. Yellowing in tubing; however foley keeps filling up with pink urine. Dr. Langston MaskerMorris came to check on patient and while in room this RN notified and showed Dr. Langston MaskerMorris urine. Dr. Langston MaskerMorris not concerned, as urine looks better than when she saw it previously. Will continue to monitor. Earl Galasborne, Linda HedgesStefanie [place]

## 2017-07-04 NOTE — Anesthesia Postprocedure Evaluation (Signed)
Anesthesia Post Note  Patient: Allison Mcdowell  Procedure(s) Performed: Procedure(s) (LRB): CESAREAN SECTION (N/A)     Patient location during evaluation: PACU Anesthesia Type: Epidural Level of consciousness: awake and alert and oriented Pain management: pain level controlled Vital Signs Assessment: post-procedure vital signs reviewed and stable Respiratory status: spontaneous breathing, nonlabored ventilation and respiratory function stable Cardiovascular status: blood pressure returned to baseline and stable Postop Assessment: no signs of nausea or vomiting, epidural receding, no backache, no headache and patient able to bend at knees Anesthetic complications: no    Last Vitals:  Vitals:   07/04/17 1145 07/04/17 1600  BP: (!) 103/45 (!) 97/53  Pulse: 85 70  Resp: 16 18  Temp: 37.4 C 36.9 C  SpO2: 95% 95%    Last Pain:  Vitals:   07/04/17 1744  TempSrc:   PainSc: 3    Pain Goal: Patients Stated Pain Goal: 3 (07/04/17 1744)               Shahira Fiske A.

## 2017-07-04 NOTE — Plan of Care (Signed)
Problem: Education: Goal: Knowledge of condition will improve Admission education, baby and me booklet and unit protocols reviewed with patient and significant other. Patient understands not to ambulate without assistance until otherwise instructed.

## 2017-07-04 NOTE — Anesthesia Procedure Notes (Deleted)
Epidural

## 2017-07-05 LAB — CBC
HEMATOCRIT: 18.5 % — AB (ref 36.0–46.0)
HEMATOCRIT: 22.3 % — AB (ref 36.0–46.0)
Hemoglobin: 6.4 g/dL — CL (ref 12.0–15.0)
Hemoglobin: 7.8 g/dL — ABNORMAL LOW (ref 12.0–15.0)
MCH: 32.3 pg (ref 26.0–34.0)
MCH: 32.6 pg (ref 26.0–34.0)
MCHC: 34.6 g/dL (ref 30.0–36.0)
MCHC: 35 g/dL (ref 30.0–36.0)
MCV: 93.4 fL (ref 78.0–100.0)
MCV: 93.3 fL (ref 78.0–100.0)
PLATELETS: 96 10*3/uL — AB (ref 150–400)
Platelets: 119 10*3/uL — ABNORMAL LOW (ref 150–400)
RBC: 1.98 MIL/uL — AB (ref 3.87–5.11)
RBC: 2.39 MIL/uL — ABNORMAL LOW (ref 3.87–5.11)
RDW: 13.9 % (ref 11.5–15.5)
RDW: 14.1 % (ref 11.5–15.5)
WBC: 7.3 10*3/uL (ref 4.0–10.5)
WBC: 8.4 10*3/uL (ref 4.0–10.5)

## 2017-07-05 LAB — CBC WITH DIFFERENTIAL/PLATELET
Basophils Absolute: 0 10*3/uL (ref 0.0–0.1)
Basophils Relative: 0 %
EOS PCT: 0 %
Eosinophils Absolute: 0 10*3/uL (ref 0.0–0.7)
HCT: 27.5 % — ABNORMAL LOW (ref 36.0–46.0)
Hemoglobin: 9.4 g/dL — ABNORMAL LOW (ref 12.0–15.0)
LYMPHS ABS: 2 10*3/uL (ref 0.7–4.0)
LYMPHS PCT: 20 %
MCH: 31.4 pg (ref 26.0–34.0)
MCHC: 34.2 g/dL (ref 30.0–36.0)
MCV: 92 fL (ref 78.0–100.0)
MONO ABS: 0.3 10*3/uL (ref 0.1–1.0)
MONOS PCT: 3 %
Neutro Abs: 7.4 10*3/uL (ref 1.7–7.7)
Neutrophils Relative %: 77 %
Platelets: 127 10*3/uL — ABNORMAL LOW (ref 150–400)
RBC: 2.99 MIL/uL — AB (ref 3.87–5.11)
RDW: 14.9 % (ref 11.5–15.5)
WBC: 9.8 10*3/uL (ref 4.0–10.5)

## 2017-07-05 LAB — PREPARE RBC (CROSSMATCH)

## 2017-07-05 MED ORDER — DIPHENHYDRAMINE HCL 25 MG PO CAPS
25.0000 mg | ORAL_CAPSULE | Freq: Once | ORAL | Status: AC
  Administered 2017-07-05: 25 mg via ORAL
  Filled 2017-07-05: qty 1

## 2017-07-05 MED ORDER — SODIUM CHLORIDE 0.9 % IV SOLN: Freq: Once | INTRAVENOUS | Status: DC

## 2017-07-05 NOTE — Progress Notes (Signed)
CRITICAL VALUE ALERT  Critical Value:  Hemoglobin 6.4  Date & Time Notied: 07/05/2017 at 0800  Provider Notified: Dr. Marcelle OverlieHolland  Orders Received/Actions taken: No new orders at this time

## 2017-07-05 NOTE — Progress Notes (Signed)
MOB was referred for history of depression/anxiety. * Referral screened out by Clinical Social Worker because none of the following criteria appear to apply: ~ History of anxiety/depression during this pregnancy, or of post-partum depression. ~ Diagnosis of anxiety and/or depression within last 3 years OR * MOB's symptoms currently being treated with medication and/or therapy. Please contact the Clinical Social Worker if needs arise, by MOB request, or if MOB scores 9 or greater/yes to question 10 on Edinburgh Postpartum Depression Screen.  MOB has Rx for Celexa. 

## 2017-07-05 NOTE — Progress Notes (Signed)
See RN note, will transf for orthostatic post op anemia

## 2017-07-05 NOTE — Plan of Care (Signed)
Problem: Activity: Goal: Risk for activity intolerance will decrease Discussed plan with patient to increase ambulation today after patient eats breakfast. Encouraged patient to increase amount eaten at meal time to increase strength.   Problem: Urinary Elimination: Goal: Ability to reestablish a normal urinary elimination pattern will improve Plan to discontinue foley upon ambulation.

## 2017-07-05 NOTE — Progress Notes (Signed)
Notified Dr. Marcelle OverlieHolland of new hemoglobin this morning and platelet count of 96. Dr. Marcelle OverlieHolland stated ibuprofen may still be given and epidural may come out now if anesthesia will allow to pull due to platelet count. Called Dr. Mal AmabileBrock from anesthesia who stated epidural may be removed. Earl Galasborne, Linda HedgesStefanie [place]

## 2017-07-05 NOTE — Progress Notes (Signed)
Subjective: Postpartum Day 1: Cesarean Delivery Patient reports tolerating PO.    Objective: Vital signs in last 24 hours: Temp:  [97.7 F (36.5 C)-99.4 F (37.4 C)] 97.7 F (36.5 C) (09/11 0643) Pulse Rate:  [63-89] 63 (09/11 0643) Resp:  [15-18] 18 (09/11 0643) BP: (84-108)/(45-54) 84/46 (09/11 0643) SpO2:  [95 %-96 %] 95 % (09/11 0038)  Physical Exam:  General: alert Lochia: appropriate Uterine Fundus: firm Incision: healing well DVT Evaluation: No evidence of DVT seen on physical exam.   Recent Labs  07/04/17 1142 07/05/17 0631  HGB 7.4* 6.4*  HCT 21.3* 18.5*    Assessment/Plan: Status post Cesarean section. Doing well postoperatively.  Continue current care Follow CBC , consider PRBC if orthostatic upon ambulating.  Meriel PicaHOLLAND,Timmi Devora M 07/05/2017, 9:38 AM

## 2017-07-05 NOTE — Progress Notes (Signed)
Patient unable to stand even to a bedside commode yet due to being orthostatic. Foley still in place; however, order was written to remove this morning. Called Dr. Marcelle OverlieHolland requesting to leave foley in place until PRBC's have been transfused and patient able to stand. Dr. Marcelle OverlieHolland stated that foley could stay in place until patient able to ambulate. Earl Galasborne, Linda HedgesStefanie [place]

## 2017-07-05 NOTE — Progress Notes (Signed)
Assisted patient to sit on side of bed. Patient began to feel lightheaded and then stated she was "seeing black" and her hearing was muffled. BP lying 97/51, HR 66. BP sitting 82/42, HR 106. Assisted patient to lie back in bed. Called Dr. Marcelle OverlieHolland to notify; left message. Awaiting reply. Earl Galasborne, Linda HedgesStefanie [place]

## 2017-07-06 LAB — BPAM RBC
BLOOD PRODUCT EXPIRATION DATE: 201810012359
Blood Product Expiration Date: 201810122359
ISSUE DATE / TIME: 201809111435
ISSUE DATE / TIME: 201809111232
UNIT TYPE AND RH: 5100
Unit Type and Rh: 5100

## 2017-07-06 LAB — CBC
HCT: 25.7 % — ABNORMAL LOW (ref 36.0–46.0)
Hemoglobin: 9 g/dL — ABNORMAL LOW (ref 12.0–15.0)
MCH: 31.8 pg (ref 26.0–34.0)
MCHC: 35 g/dL (ref 30.0–36.0)
MCV: 90.8 fL (ref 78.0–100.0)
PLATELETS: 115 10*3/uL — AB (ref 150–400)
RBC: 2.83 MIL/uL — AB (ref 3.87–5.11)
RDW: 15.4 % (ref 11.5–15.5)
WBC: 8.3 10*3/uL (ref 4.0–10.5)

## 2017-07-06 LAB — TYPE AND SCREEN
ABO/RH(D): B POS
Antibody Screen: NEGATIVE
UNIT DIVISION: 0
Unit division: 0

## 2017-07-06 NOTE — Progress Notes (Signed)
Subjective: Postpartum Day 2: Cesarean Delivery Patient reports tolerating PO, + flatus and no problems voiding.   Ambulating without dizziness Objective: Vital signs in last 24 hours: Temp:  [97.8 F (36.6 C)-98.6 F (37 C)] 97.8 F (36.6 C) (09/12 0757) Pulse Rate:  [52-90] 52 (09/12 0516) Resp:  [16-20] 18 (09/12 0516) BP: (85-98)/(40-59) 98/56 (09/12 0516) SpO2:  [95 %-100 %] 100 % (09/11 1736)  Physical Exam:  General: alert, cooperative and no distress Lochia: appropriate Uterine Fundus: firm Incision: healing well DVT Evaluation: No evidence of DVT seen on physical exam.   Recent Labs  07/05/17 1817 07/06/17 0549  HGB 9.4* 9.0*  HCT 27.5* 25.7*    Assessment/Plan: Status post Cesarean section. Postoperative course complicated by symptomatic anemia now S/P transfusion PRBC  Continue current care.  Allison Mcdowell,Allison Mcdowell 07/06/2017, 8:16 AM

## 2017-07-07 MED ORDER — OXYCODONE HCL 5 MG PO TABS: 5.0000 mg | ORAL_TABLET | ORAL | 0 refills | Status: DC | PRN

## 2017-07-07 MED ORDER — IBUPROFEN 600 MG PO TABS: 600.0000 mg | ORAL_TABLET | Freq: Four times a day (QID) | ORAL | 0 refills | Status: DC

## 2017-07-07 MED ORDER — FERROUS SULFATE 325 (65 FE) MG PO TABS: 325.0000 mg | ORAL_TABLET | Freq: Two times a day (BID) | ORAL | 3 refills | Status: DC

## 2017-07-07 NOTE — Discharge Summary (Signed)
Obstetric Discharge Summary Reason for Admission: onset of labor Prenatal Procedures: none Intrapartum Procedures: cesarean: low cervical, transverse Postpartum Procedures: transfusion prbc Complications-Operative and Postpartum: none Hemoglobin  Date Value Ref Range Status  07/06/2017 9.0 (L) 12.0 - 15.0 g/dL Final   HCT  Date Value Ref Range Status  07/06/2017 25.7 (L) 36.0 - 46.0 % Final    Physical Exam:  General: alert, cooperative and appears stated age 46Lochia: appropriate Uterine Fundus: firm Incision: healing well, no significant drainage, no dehiscence, no significant erythema DVT Evaluation: No evidence of DVT seen on physical exam.  Discharge Diagnoses: Term Pregnancy-delivered  Discharge Information: Date: 07/07/2017 Activity: pelvic rest Diet: routine Medications: Ibuprofen, Iron and Percocet Condition: improved Instructions: refer to practice specific booklet Discharge to: home   Newborn Data: Live born female  Birth Weight: 7 lb 6.3 oz (3355 g) APGAR: 7, 9  Home with mother.  Creek Gan L 07/07/2017, 8:52 AM

## 2017-07-10 ENCOUNTER — Inpatient Hospital Stay (HOSPITAL_COMMUNITY)
Admission: AD | Admit: 2017-07-10 | Discharge: 2017-07-11 | Disposition: A | Discharge status: Home / Self Care | Payer: BLUE CROSS/BLUE SHIELD | Source: Ambulatory Visit | Attending: Obstetrics and Gynecology | Admitting: Obstetrics and Gynecology

## 2017-07-10 ENCOUNTER — Encounter (HOSPITAL_COMMUNITY): Payer: Self-pay | Admitting: *Deleted

## 2017-07-10 DIAGNOSIS — Z823 Family history of stroke: Secondary | ICD-10-CM | POA: Diagnosis not present

## 2017-07-10 DIAGNOSIS — O864 Pyrexia of unknown origin following delivery: Secondary | ICD-10-CM | POA: Diagnosis not present

## 2017-07-10 DIAGNOSIS — G8918 Other acute postprocedural pain: Secondary | ICD-10-CM | POA: Diagnosis not present

## 2017-07-10 DIAGNOSIS — R109 Unspecified abdominal pain: Secondary | ICD-10-CM | POA: Diagnosis not present

## 2017-07-10 DIAGNOSIS — F419 Anxiety disorder, unspecified: Secondary | ICD-10-CM | POA: Insufficient documentation

## 2017-07-10 DIAGNOSIS — Z8249 Family history of ischemic heart disease and other diseases of the circulatory system: Secondary | ICD-10-CM | POA: Diagnosis not present

## 2017-07-10 NOTE — MAU Note (Signed)
Pt here with fever and post op incisional pain. Temp was 101.6 at home about 1900 or 2000, took Motrin  then. Temp now 99.1. Had C/S on Monday morning.

## 2017-07-10 NOTE — MAU Note (Addendum)
Pt had a fever around 7-8 PM. Pt took 600 ibuprofen and Oxycodone at that time. Pt had chills and then was sweating. Pt reports pain in lower abdomen since this morning. Pt thought it may be gas this morning, but pain has increased throughout the day, and then she developed a fever. Pt is also reporting body aches and headache.

## 2017-07-10 NOTE — MAU Provider Note (Signed)
History   [id number]   Chief Complaint  Patient presents with  . Fever  . Incisional Pain    HPI Allison Mcdowell is a 26 y.o. female  G1P1001 here status/post csection on 07/04/17 for arrest of descent.  Upon review of the op note, left cervical laceration repaired along with repair for bleeding noted at the bladder flap, otherwise uncomplicated.  Received blood transfusion for symptomatic anemia.    Pt reports fever of 101.6 at home at 1900. Took 600 mg ibuprofen.  Pain is described as constant, deep and sharp rated 9/10.  Any movement aggravates pain.  Difficult to care for newborn.  +bottle feeding.  Patient is here with her mother.  Reports passing gas and having normal bowel movements.  Urinating without difficulty, +clear urine.   No LMP recorded.  OB History  Gravida Para Term Preterm AB Living  SAB TAB Ectopic Multiple Live Births        0 1    # Outcome Date GA Lbr Len/2nd Weight Sex Delivery Anes PTL Lv  1 Term 07/04/17 [redacted]w[redacted]d 05:48 / 03:59 7 lb 6.3 oz (3.355 kg) M CS-LTranv EPI  LIV      Past Medical History:  Diagnosis Date  . Anxiety   . Complication of anesthesia   . Infertility, female   . PONV (postoperative nausea and vomiting)     Family History  Problem Relation Age of Onset  . Thyroid disease Mother   . Hypertension Father   . Thyroid disease Maternal Aunt   . Thyroid disease Maternal Grandmother   . Heart disease Maternal Grandfather   . Hypertension Paternal Grandmother   . Hypertension Paternal Grandfather   . Stroke Paternal Grandfather     Social History   Social History  . Marital status: Married    Spouse name: N/A  . Number of children: N/A  . Years of education: N/A   Social History Main Topics  . Smoking status: Never Smoker  . Smokeless tobacco: Never Used  . Alcohol use No  . Drug use: No  . Sexual activity: Not Currently   Other Topics Concern  . None   Social History Narrative  . None    No Known  Allergies  No current facility-administered medications on file prior to encounter.    Current Outpatient Prescriptions on File Prior to Encounter  Medication Sig Dispense Refill  . citalopram (CELEXA) 10 MG tablet Take 10 mg by mouth daily.    . ferrous sulfate 325 (65 FE) MG tablet Take 1 tablet (325 mg total) by mouth 2 (two) times daily with a meal. 30 tablet 3  . ibuprofen (ADVIL,MOTRIN) 600 MG tablet Take 1 tablet (600 mg total) by mouth every 6 (six) hours. 30 tablet 0  . oxyCODONE (OXY IR/ROXICODONE) 5 MG immediate release tablet Take 1 tablet (5 mg total) by mouth every 4 (four) hours as needed (pain scale 4-7). 30 tablet 0  . Prenatal Vit-Fe Fumarate-FA (PRENATAL MULTIVITAMIN) TABS tablet Take 1 tablet by mouth daily at 12 noon.       Review of Systems  Constitutional: Positive for chills and fever.  Gastrointestinal: Positive for abdominal pain. Negative for constipation, diarrhea, nausea and vomiting.  Genitourinary: Positive for pelvic pain. Negative for difficulty urinating, dysuria, flank pain and frequency.  Neurological: Negative for dizziness, light-headedness and headaches.     Physical Exam   Vitals:   07/10/17 2315 07/10/17 2316 07/11/17 0109 07/11/17  0245  BP:  108/65 (!) 88/55 106/65  Pulse:  (!) 107 75 70  Resp:  Temp:  99.1 F (37.3 C) 98.6 F (37 C)   TempSrc:  Oral    SpO2: 100%     Weight:  137 lb (62.1 kg)    Height:   (1.6 m)      Physical Exam  Constitutional: She is oriented to person, place, and time. She appears well-developed and well-nourished.  HENT:  Head: Normocephalic.  Neck: Normal range of motion. Neck supple.  Cardiovascular: Normal rate, regular rhythm and normal heart sounds.   Respiratory: Effort normal and breath sounds normal. No respiratory distress.  GI: Soft. There is no tenderness. There is no guarding.  Incision site healing well; well approximated, no signs of infection; no palpable mass.  Lower pelvis  extremely tender to the touch with radiation of pain to mid right abdomen.    Genitourinary: No bleeding in the vagina.  Musculoskeletal: Normal range of motion. She exhibits no edema.  Neurological: She is alert and oriented to person, place, and time. She has normal reflexes.  Skin: Skin is warm and dry.    MAU Course  Procedures 0100 Consulted with Dr. Elon Spanner > Reviewed HPI/Exam/CBC pending > obtain CT scan  MDM 0110 Dilaudid 2 mg IM > pt reports decrease in pain  Results for orders placed or performed during the hospital encounter of 07/10/17 (from the past 24 hour(s))  CBC     Status: Abnormal   Collection Time: 07/10/17 11:59 PM  Result Value Ref Range   WBC 9.7 4.0 - 10.5 K/uL   RBC 3.38 (L) 3.87 - 5.11 MIL/uL   Hemoglobin 10.4 (L) 12.0 - 15.0 g/dL   HCT 16.1 (L) 09.6 - 04.5 %   MCV 90.5 78.0 - 100.0 fL   MCH 30.8 26.0 - 34.0 pg   MCHC 34.0 30.0 - 36.0 g/dL   RDW 40.9 81.1 - 91.4 %   Platelets 208 150 - 400 K/uL   CT: IMPRESSION: 1. Enlarged postpartum uterus. Nonspecific prominence of the endometrium. Can't exclude retained products of conception on CT. 2. Mild infiltration and gas in the subcutaneous fat over the low anterior abdominal wall likely representing postoperative change. No loculated collection to suggest an abscess. 3. Small bilateral pleural effusions with basilar atelectasis greater on the left. 4. Small amount of free fluid is likely postoperative.  0400 Reviewed results of CBC and CT scan with Dr. Elon Spanner > discharge patient home with instructions to take pain meds on a set schedule to decrease post-op pain; instruct patient if fever develops to call office and not take Tylenol or ibuprofen until instructed  Assessment and Plan  Post-op Pain  Plan: Discharge home Reviewed importance of taking pain medications as recommended Call office if fever develops  Marlis Edelson, CNM 07/11/2017 4:20 AM

## 2017-07-11 ENCOUNTER — Encounter (HOSPITAL_COMMUNITY): Payer: Self-pay

## 2017-07-11 ENCOUNTER — Inpatient Hospital Stay (HOSPITAL_COMMUNITY): Payer: BLUE CROSS/BLUE SHIELD

## 2017-07-11 DIAGNOSIS — G8918 Other acute postprocedural pain: Secondary | ICD-10-CM

## 2017-07-11 DIAGNOSIS — R109 Unspecified abdominal pain: Secondary | ICD-10-CM | POA: Diagnosis not present

## 2017-07-11 LAB — CBC
HEMATOCRIT: 30.6 % — AB (ref 36.0–46.0)
HEMOGLOBIN: 10.4 g/dL — AB (ref 12.0–15.0)
MCH: 30.8 pg (ref 26.0–34.0)
MCHC: 34 g/dL (ref 30.0–36.0)
MCV: 90.5 fL (ref 78.0–100.0)
Platelets: 208 10*3/uL (ref 150–400)
RBC: 3.38 MIL/uL — ABNORMAL LOW (ref 3.87–5.11)
RDW: 14.8 % (ref 11.5–15.5)
WBC: 9.7 10*3/uL (ref 4.0–10.5)

## 2017-07-11 MED ORDER — IOPAMIDOL (ISOVUE-300) INJECTION 61%
100.0000 mL | Freq: Once | INTRAVENOUS | Status: AC | PRN
  Administered 2017-07-11: 100 mL via INTRAVENOUS

## 2017-07-11 MED ORDER — HYDROMORPHONE HCL 2 MG/ML IJ SOLN
2.0000 mg | Freq: Once | INTRAMUSCULAR | Status: AC
  Administered 2017-07-11: 2 mg via INTRAMUSCULAR
  Filled 2017-07-11: qty 1

## 2017-07-11 NOTE — Discharge Instructions (Signed)
IF FEVER, CALL PRACTICE.  DO NOT TAKE TYLENOL OR IBUPROFEN.

## 2017-07-13 DIAGNOSIS — D649 Anemia, unspecified: Secondary | ICD-10-CM | POA: Diagnosis not present

## 2017-07-21 DIAGNOSIS — D1801 Hemangioma of skin and subcutaneous tissue: Secondary | ICD-10-CM | POA: Diagnosis not present

## 2017-07-21 DIAGNOSIS — Z872 Personal history of diseases of the skin and subcutaneous tissue: Secondary | ICD-10-CM | POA: Diagnosis not present

## 2017-07-21 DIAGNOSIS — D225 Melanocytic nevi of trunk: Secondary | ICD-10-CM | POA: Diagnosis not present

## 2017-08-16 DIAGNOSIS — Z1389 Encounter for screening for other disorder: Secondary | ICD-10-CM | POA: Diagnosis not present

## 2017-08-16 DIAGNOSIS — Z23 Encounter for immunization: Secondary | ICD-10-CM | POA: Diagnosis not present

## 2017-11-01 DIAGNOSIS — K130 Diseases of lips: Secondary | ICD-10-CM | POA: Diagnosis not present

## 2017-11-01 DIAGNOSIS — L218 Other seborrheic dermatitis: Secondary | ICD-10-CM | POA: Diagnosis not present

## 2017-11-14 DIAGNOSIS — L259 Unspecified contact dermatitis, unspecified cause: Secondary | ICD-10-CM | POA: Diagnosis not present

## 2017-11-18 DIAGNOSIS — L309 Dermatitis, unspecified: Secondary | ICD-10-CM | POA: Diagnosis not present

## 2017-12-05 DIAGNOSIS — F4320 Adjustment disorder, unspecified: Secondary | ICD-10-CM | POA: Diagnosis not present

## 2017-12-20 DIAGNOSIS — F4322 Adjustment disorder with anxiety: Secondary | ICD-10-CM | POA: Diagnosis not present

## 2018-08-08 DIAGNOSIS — R05 Cough: Secondary | ICD-10-CM | POA: Diagnosis not present

## 2018-08-09 DIAGNOSIS — B349 Viral infection, unspecified: Secondary | ICD-10-CM | POA: Diagnosis not present

## 2018-08-17 DIAGNOSIS — Z6821 Body mass index (BMI) 21.0-21.9, adult: Secondary | ICD-10-CM | POA: Diagnosis not present

## 2018-08-17 DIAGNOSIS — Z01419 Encounter for gynecological examination (general) (routine) without abnormal findings: Secondary | ICD-10-CM | POA: Diagnosis not present

## 2018-08-23 DIAGNOSIS — Z808 Family history of malignant neoplasm of other organs or systems: Secondary | ICD-10-CM | POA: Diagnosis not present

## 2018-08-23 DIAGNOSIS — D1801 Hemangioma of skin and subcutaneous tissue: Secondary | ICD-10-CM | POA: Diagnosis not present

## 2018-08-23 DIAGNOSIS — D225 Melanocytic nevi of trunk: Secondary | ICD-10-CM | POA: Diagnosis not present

## 2018-08-23 DIAGNOSIS — D485 Neoplasm of uncertain behavior of skin: Secondary | ICD-10-CM | POA: Diagnosis not present

## 2018-08-23 DIAGNOSIS — Z872 Personal history of diseases of the skin and subcutaneous tissue: Secondary | ICD-10-CM | POA: Diagnosis not present

## 2018-11-03 DIAGNOSIS — M25561 Pain in right knee: Secondary | ICD-10-CM | POA: Diagnosis not present

## 2018-11-17 DIAGNOSIS — M1990 Unspecified osteoarthritis, unspecified site: Secondary | ICD-10-CM | POA: Diagnosis not present

## 2018-11-17 DIAGNOSIS — F3342 Major depressive disorder, recurrent, in full remission: Secondary | ICD-10-CM | POA: Diagnosis not present

## 2018-11-17 DIAGNOSIS — R946 Abnormal results of thyroid function studies: Secondary | ICD-10-CM | POA: Diagnosis not present

## 2018-11-17 DIAGNOSIS — Z0000 Encounter for general adult medical examination without abnormal findings: Secondary | ICD-10-CM | POA: Diagnosis not present

## 2018-12-11 DIAGNOSIS — N970 Female infertility associated with anovulation: Secondary | ICD-10-CM | POA: Diagnosis not present

## 2018-12-11 DIAGNOSIS — Z3200 Encounter for pregnancy test, result unknown: Secondary | ICD-10-CM | POA: Diagnosis not present

## 2019-01-01 DIAGNOSIS — F4320 Adjustment disorder, unspecified: Secondary | ICD-10-CM | POA: Diagnosis not present

## 2019-01-05 ENCOUNTER — Emergency Department (HOSPITAL_COMMUNITY)
Admission: EM | Admit: 2019-01-05 | Discharge: 2019-01-05 | Disposition: A | Discharge status: Home / Self Care | Payer: BLUE CROSS/BLUE SHIELD | Attending: Emergency Medicine | Admitting: Emergency Medicine

## 2019-01-05 ENCOUNTER — Other Ambulatory Visit: Payer: Self-pay

## 2019-01-05 ENCOUNTER — Encounter (HOSPITAL_COMMUNITY): Payer: Self-pay

## 2019-01-05 DIAGNOSIS — R0789 Other chest pain: Secondary | ICD-10-CM | POA: Insufficient documentation

## 2019-01-05 DIAGNOSIS — Z79899 Other long term (current) drug therapy: Secondary | ICD-10-CM | POA: Diagnosis not present

## 2019-01-05 DIAGNOSIS — R197 Diarrhea, unspecified: Secondary | ICD-10-CM | POA: Insufficient documentation

## 2019-01-05 DIAGNOSIS — R509 Fever, unspecified: Secondary | ICD-10-CM | POA: Diagnosis not present

## 2019-01-05 DIAGNOSIS — E860 Dehydration: Secondary | ICD-10-CM | POA: Diagnosis not present

## 2019-01-05 DIAGNOSIS — R112 Nausea with vomiting, unspecified: Secondary | ICD-10-CM | POA: Diagnosis not present

## 2019-01-05 LAB — COMPREHENSIVE METABOLIC PANEL
ALT: 20 U/L (ref 0–44)
AST: 23 U/L (ref 15–41)
Albumin: 3.8 g/dL (ref 3.5–5.0)
Alkaline Phosphatase: 49 U/L (ref 38–126)
Anion gap: 10 (ref 5–15)
BILIRUBIN TOTAL: 0.8 mg/dL (ref 0.3–1.2)
BUN: 19 mg/dL (ref 6–20)
CO2: 25 mmol/L (ref 22–32)
Calcium: 8.3 mg/dL — ABNORMAL LOW (ref 8.9–10.3)
Chloride: 100 mmol/L (ref 98–111)
Creatinine, Ser: 0.79 mg/dL (ref 0.44–1.00)
GFR calc Af Amer: >60 mL/min (ref >60)
GFR calc non Af Amer: >60 mL/min (ref >60)
Glucose, Bld: 105 mg/dL — ABNORMAL HIGH (ref 70–99)
POTASSIUM: 3.5 mmol/L (ref 3.5–5.1)
Sodium: 135 mmol/L (ref 135–145)
Total Protein: 6.6 g/dL (ref 6.5–8.1)

## 2019-01-05 LAB — URINALYSIS, ROUTINE W REFLEX MICROSCOPIC
BILIRUBIN URINE: NEGATIVE
Glucose, UA: NEGATIVE mg/dL
Hgb urine dipstick: NEGATIVE
Ketones, ur: 40 mg/dL — AB
Leukocytes,Ua: NEGATIVE
Nitrite: NEGATIVE
Protein, ur: NEGATIVE mg/dL
SPECIFIC GRAVITY, URINE: 1.015 (ref 1.005–1.030)
pH: 6 (ref 5.0–8.0)

## 2019-01-05 LAB — CBC
HCT: 44 % (ref 36.0–46.0)
Hemoglobin: 14.3 g/dL (ref 12.0–15.0)
MCH: 30.6 pg (ref 26.0–34.0)
MCHC: 32.5 g/dL (ref 30.0–36.0)
MCV: 94.2 fL (ref 80.0–100.0)
Platelets: 158 10*3/uL (ref 150–400)
RBC: 4.67 MIL/uL (ref 3.87–5.11)
RDW: 11.5 % (ref 11.5–15.5)
WBC: 3.1 10*3/uL — ABNORMAL LOW (ref 4.0–10.5)
nRBC: 0 % (ref 0.0–0.2)

## 2019-01-05 LAB — LIPASE, BLOOD: Lipase: 26 U/L (ref 11–51)

## 2019-01-05 LAB — I-STAT BETA HCG BLOOD, ED (MC, WL, AP ONLY): I-stat hCG, quantitative: <5 m[IU]/mL (ref <5)

## 2019-01-05 MED ORDER — ONDANSETRON HCL 4 MG/2ML IJ SOLN
4.0000 mg | Freq: Once | INTRAMUSCULAR | Status: AC
Start: 2019-01-05 — End: 2019-01-05
  Administered 2019-01-05: 4 mg via INTRAVENOUS
  Filled 2019-01-05: qty 2

## 2019-01-05 MED ORDER — DICYCLOMINE HCL 10 MG PO CAPS
20.0000 mg | ORAL_CAPSULE | Freq: Once | ORAL | Status: AC
  Administered 2019-01-05: 20 mg via ORAL
  Filled 2019-01-05: qty 2

## 2019-01-05 MED ORDER — SODIUM CHLORIDE 0.9% FLUSH
3.0000 mL | Freq: Once | INTRAVENOUS | Status: AC
  Administered 2019-01-05: 3 mL via INTRAVENOUS

## 2019-01-05 MED ORDER — LIDOCAINE VISCOUS HCL 2 % MT SOLN
15.0000 mL | Freq: Once | OROMUCOSAL | Status: AC
  Administered 2019-01-05: 15 mL via ORAL
  Filled 2019-01-05: qty 15

## 2019-01-05 MED ORDER — ALUM & MAG HYDROXIDE-SIMETH 200-200-20 MG/5ML PO SUSP
30.0000 mL | Freq: Once | ORAL | Status: AC
  Administered 2019-01-05: 30 mL via ORAL
  Filled 2019-01-05: qty 30

## 2019-01-05 MED ORDER — LOPERAMIDE HCL 2 MG PO CAPS
4.0000 mg | ORAL_CAPSULE | Freq: Once | ORAL | Status: AC
  Administered 2019-01-05: 4 mg via ORAL
  Filled 2019-01-05: qty 2

## 2019-01-05 MED ORDER — SODIUM CHLORIDE 0.9 % IV BOLUS (SEPSIS)
1000.0000 mL | Freq: Once | INTRAVENOUS | Status: AC
  Administered 2019-01-05: 1000 mL via INTRAVENOUS

## 2019-01-05 MED ORDER — DICYCLOMINE HCL 20 MG PO TABS: 20.0000 mg | ORAL_TABLET | Freq: Three times a day (TID) | ORAL | 0 refills | Status: AC | PRN

## 2019-01-05 MED ORDER — ONDANSETRON 4 MG PO TBDP: 4.0000 mg | ORAL_TABLET | Freq: Four times a day (QID) | ORAL | 0 refills | Status: AC | PRN

## 2019-01-05 NOTE — ED Notes (Signed)
Pt d/c home per MD order. Discharge summary reviewed with pt. Pt Verbalizes understanding. RX provided. Pt signed e-signature. Ambulatory off unit.

## 2019-01-05 NOTE — ED Provider Notes (Signed)
TIME SEEN: 3:26 AM  CHIEF COMPLAINT: Nausea, vomiting, diarrhea  HPI: Patient is a 28 year old female with no significant past medical history who presents to the emergency department feeling like she has a "stomach bug".  Reports fevers, chills, nausea, vomiting and diarrhea for the past 2 days.  States she has now felt very lightheaded and unable to keep anything down.  States she has felt like she is going to faint when she stands up.  Having some abdominal cramping that is mild.  No recent travel, antibiotic use or hospitalization.  No sick contact.  No cough or shortness of breath.  ROS: See HPI Constitutional:  fever  Eyes: no drainage  ENT: no runny nose   Cardiovascular:  no chest pain  Resp: no SOB  GI: Vomiting and diarrhea GU: no dysuria Integumentary: no rash  Allergy: no hives  Musculoskeletal: no leg swelling  Neurological: no slurred speech ROS otherwise negative  PAST MEDICAL HISTORY/PAST SURGICAL HISTORY:  Past Medical History:  Diagnosis Date  . Anxiety   . Complication of anesthesia   . Infertility, female   . PONV (postoperative nausea and vomiting)     MEDICATIONS:  Prior to Admission medications   Medication Sig Start Date End Date Taking? Authorizing Provider  citalopram (CELEXA) 10 MG tablet Take 10 mg by mouth daily.    [provider]  ferrous sulfate 325 (65 FE) MG tablet Take 1 tablet (325 mg total) by mouth 2 (two) times daily with a meal. 07/07/17   Marcelle Overlie, MD  ibuprofen (ADVIL,MOTRIN) 600 MG tablet Take 1 tablet (600 mg total) by mouth every 6 (six) hours. 07/07/17   Marcelle Overlie, MD  oxyCODONE (OXY IR/ROXICODONE) 5 MG immediate release tablet Take 1 tablet (5 mg total) by mouth every 4 (four) hours as needed (pain scale 4-7). 07/07/17   Marcelle Overlie, MD  Prenatal Vit-Fe Fumarate-FA (PRENATAL MULTIVITAMIN) TABS tablet Take 1 tablet by mouth daily at 12 noon.    [provider]    ALLERGIES:  No Known  Allergies  SOCIAL HISTORY:  Social History   Tobacco Use  . Smoking status: Never Smoker  . Smokeless tobacco: Never Used  Substance Use Topics  . Alcohol use: No    FAMILY HISTORY: Family History  Problem Relation Age of Onset  . Thyroid disease Mother   . Hypertension Father   . Thyroid disease Maternal Aunt   . Thyroid disease Maternal Grandmother   . Heart disease Maternal Grandfather   . Hypertension Paternal Grandmother   . Hypertension Paternal Grandfather   . Stroke Paternal Grandfather     EXAM: BP 93/67 (BP Location: Right Arm)   Pulse 100   Temp 99.2 F (37.3 C) (Oral)   Resp 18   Ht 5\' 3"  (1.6 m)   Wt 51.3 kg   SpO2 99%   BMI 20.02 kg/m  CONSTITUTIONAL: Alert and oriented and responds appropriately to questions. Well-appearing; well-nourished HEAD: Normocephalic EYES: Conjunctivae clear, pupils appear equal, EOMI ENT: normal nose; moist mucous membranes NECK: Supple, no meningismus, no nuchal rigidity, no LAD  CARD: RRR; S1 and S2 appreciated; no murmurs, no clicks, no rubs, no gallops RESP: Normal chest excursion without splinting or tachypnea; breath sounds clear and equal bilaterally; no wheezes, no rhonchi, no rales, no hypoxia or respiratory distress, speaking full sentences ABD/GI: Normal bowel sounds; non-distended; soft, non-tender, no rebound, no guarding, no peritoneal signs, no hepatosplenomegaly BACK:  The back appears normal and is non-tender to palpation, there is  no CVA tenderness EXT: Normal ROM in all joints; non-tender to palpation; no edema; normal capillary refill; no cyanosis, no calf tenderness or swelling    SKIN: Normal color for age and race; warm; no rash NEURO: Moves all extremities equally PSYCH: The patient's mood and manner are appropriate. Grooming and personal hygiene are appropriate.  MEDICAL DECISION MAKING: Patient here with likely viral gastroenteritis.  Abdominal exam benign.  Labs, urine pending.  She does have soft  blood pressures here in the ED.  We will hydrate patient with 2 L of IV fluids.  Will give Zofran, Imodium for symptomatic relief.  ED PROGRESS: 6:35 AM  Patient reports feeling much better.  Nausea, diarrhea, dizziness and abdominal cramping have resolved.  Labs unremarkable.  Urine pending.  Able to eat and drink in the ED.  6:50 AM Patient able to walk to the bathroom without feeling dizzy.  Does complain of some chest discomfort that she feels is like a heaviness.  Lungs are clear.  Heart sounds normal.  Doubt ACS, PE or dissection.  States this feels somewhat like indigestion.  Will obtain screening EKG and give GI cocktail.  7:25 AM  Pt's blood pressures in the low 90s systolic.  Reports it is not abnormal to be in the upper 90s to low 100s.  She still has dry mucous membranes on exam and moderate ketones in her urine after 2 L.  Will order 1/3 L of fluid prior to discharge home.  She is comfortable with this plan.  She has not had any vomiting or diarrhea here.  I reviewed all nursing notes, vitals, pertinent previous records, EKGs, lab and urine results, imaging (as available).     EKG Interpretation  Date/Time:  Friday January 05 2019 07:09:18 EDT Ventricular Rate:  76 PR Interval:    QRS Duration: 79 QT Interval:  366 QTC Calculation: 412 R Axis:   88 Text Interpretation:  Sinus rhythm No old tracing to compare Confirmed by Lakindra Wible, Baxter Hire [id number]) on 01/05/2019 7:11:06 AM       CRITICAL CARE Performed by: Baxter Hire Essica Kiker   Total critical care time: 40 minutes  Critical care time was exclusive of separately billable procedures and treating other patients.  Critical care was necessary to treat or prevent imminent or life-threatening deterioration.  Critical care was time spent personally by me on the following activities: development of treatment plan with patient and/or surrogate as well as nursing, discussions with consultants, evaluation of patient's response to treatment,  examination of patient, obtaining history from patient or surrogate, ordering and performing treatments and interventions, ordering and review of laboratory studies, ordering and review of radiographic studies, pulse oximetry and re-evaluation of patient's condition.       Suleyma Wafer, Layla Maw, DO 01/05/19 [phone]

## 2019-01-05 NOTE — Discharge Instructions (Addendum)
You may alternate Tylenol 1000 mg every 6 hours as needed for pain and Ibuprofen 800 mg every 8 hours as needed for pain.  Please take Ibuprofen with food. ° °You may take over-the-counter Imodium as needed for diarrhea. °

## 2019-01-05 NOTE — ED Triage Notes (Signed)
Pt reports that she has had a "stomach bug" for the last 48 hours. She states that she is unable to keep any liquids down and is starting to feel really weak. She endorses generalized abdominal pain and cramping. She states that her 1.30 yr old son recently had similar symptoms, but has recovered. A&Ox4.

## 2019-01-05 NOTE — ED Notes (Signed)
Pt able to tolerate crackers and juice. EDP made aware.

## 2019-01-05 NOTE — ED Notes (Signed)
Pt verbalized she is unable to give urine sample at this time.

## 2019-02-14 DIAGNOSIS — Z3200 Encounter for pregnancy test, result unknown: Secondary | ICD-10-CM | POA: Diagnosis not present

## 2019-02-14 DIAGNOSIS — Z3687 Encounter for antenatal screening for uncertain dates: Secondary | ICD-10-CM | POA: Diagnosis not present

## 2019-02-16 DIAGNOSIS — Z3687 Encounter for antenatal screening for uncertain dates: Secondary | ICD-10-CM | POA: Diagnosis not present

## 2019-02-20 DIAGNOSIS — Z3687 Encounter for antenatal screening for uncertain dates: Secondary | ICD-10-CM | POA: Diagnosis not present

## 2019-02-22 DIAGNOSIS — Z3201 Encounter for pregnancy test, result positive: Secondary | ICD-10-CM | POA: Diagnosis not present

## 2019-03-07 DIAGNOSIS — Z3201 Encounter for pregnancy test, result positive: Secondary | ICD-10-CM | POA: Diagnosis not present

## 2019-03-13 DIAGNOSIS — Z3481 Encounter for supervision of other normal pregnancy, first trimester: Secondary | ICD-10-CM | POA: Diagnosis not present

## 2019-03-13 LAB — OB RESULTS CONSOLE RUBELLA ANTIBODY, IGM: Rubella: IMMUNE

## 2019-03-13 LAB — OB RESULTS CONSOLE HEPATITIS B SURFACE ANTIGEN: Hepatitis B Surface Ag: NEGATIVE

## 2019-03-13 LAB — OB RESULTS CONSOLE HIV ANTIBODY (ROUTINE TESTING): HIV: NONREACTIVE

## 2019-03-13 LAB — OB RESULTS CONSOLE RPR: RPR: NONREACTIVE

## 2019-03-28 DIAGNOSIS — Z3481 Encounter for supervision of other normal pregnancy, first trimester: Secondary | ICD-10-CM | POA: Diagnosis not present

## 2019-03-28 DIAGNOSIS — Z3689 Encounter for other specified antenatal screening: Secondary | ICD-10-CM | POA: Diagnosis not present

## 2019-03-28 LAB — OB RESULTS CONSOLE GC/CHLAMYDIA
Chlamydia: NEGATIVE
Gonorrhea: NEGATIVE

## 2019-04-19 DIAGNOSIS — Z3481 Encounter for supervision of other normal pregnancy, first trimester: Secondary | ICD-10-CM | POA: Diagnosis not present

## 2019-05-07 DIAGNOSIS — Z361 Encounter for antenatal screening for raised alphafetoprotein level: Secondary | ICD-10-CM | POA: Diagnosis not present

## 2019-05-07 DIAGNOSIS — Z3482 Encounter for supervision of other normal pregnancy, second trimester: Secondary | ICD-10-CM | POA: Diagnosis not present

## 2019-05-16 IMAGING — CT CT ABD-PELV W/ CM
1 of 2 series · 14 of 32 positions shown, 18 images · IV contrast (OMNIPAQUE)
Comparison: None.

CLINICAL DATA: Increasing lower abdominal pain at C-section site
since 07/10/2017. Now developed fever with chills and sweating.

EXAM:
CT ABDOMEN AND PELVIS WITH CONTRAST
TECHNIQUE: Multidetector CT imaging of the abdomen and pelvis was performed
using the standard protocol following bolus administration of
intravenous contrast.
CONTRAST:  100 mL Psovue-NWW

[Series 2: routine abdomen/pelvis with · axial · 0.60mm/px · z∈[+626,+1026]mm · 14 of 92 slices shown, 18 images]
[im 8/92  soft-tissue]
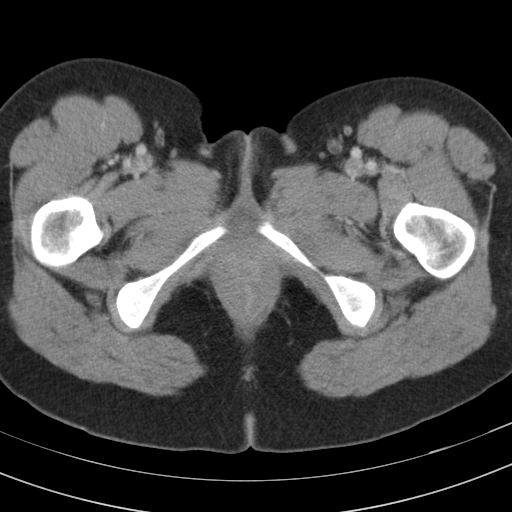
[im 8/92  bone]
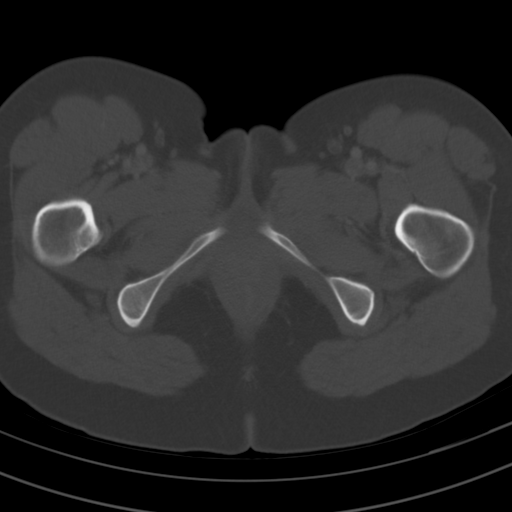
[im 15/92  soft-tissue]
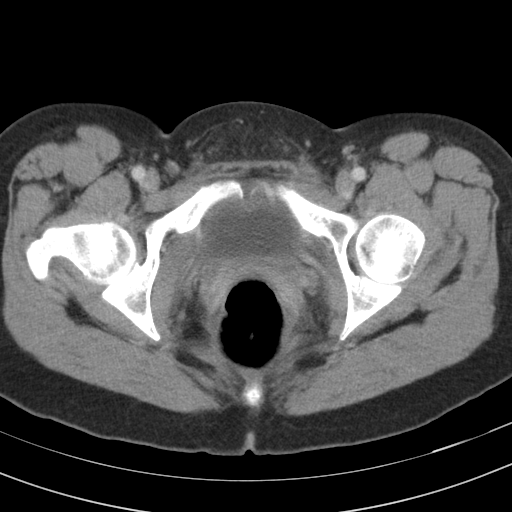
[im 22/92  soft-tissue]
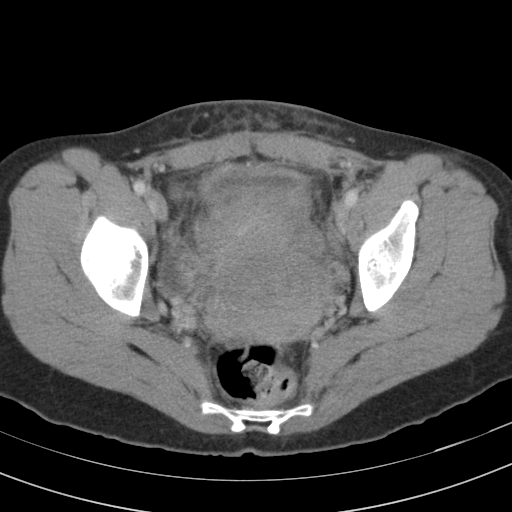
[im 29/92  soft-tissue]
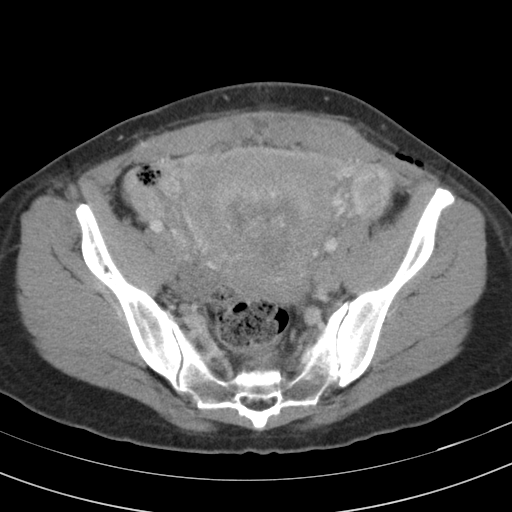
[im 36/92  soft-tissue]
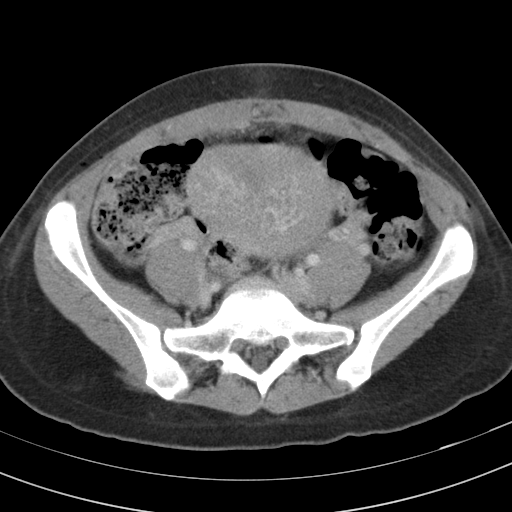
[im 43/92  soft-tissue]
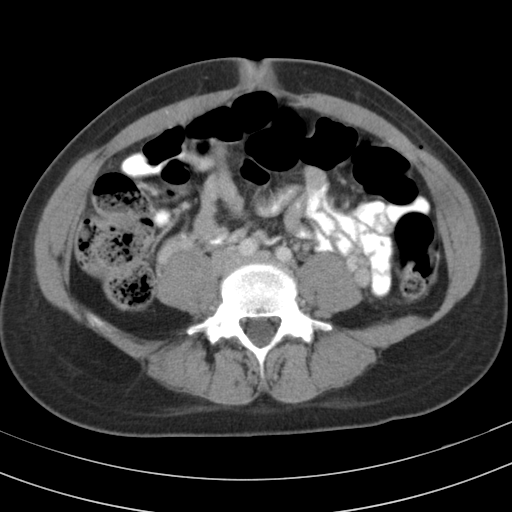
[im 50/92  soft-tissue]
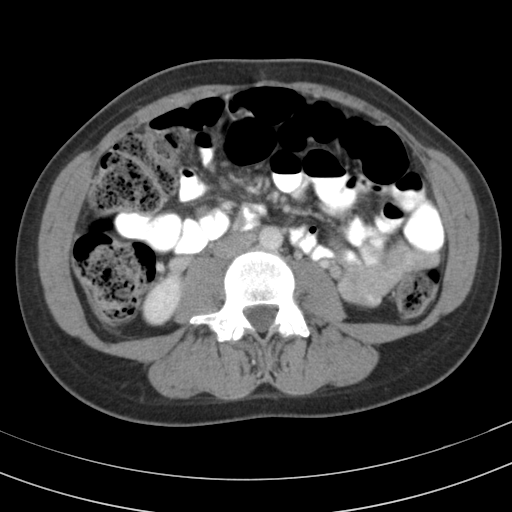
[im 57/92  soft-tissue]
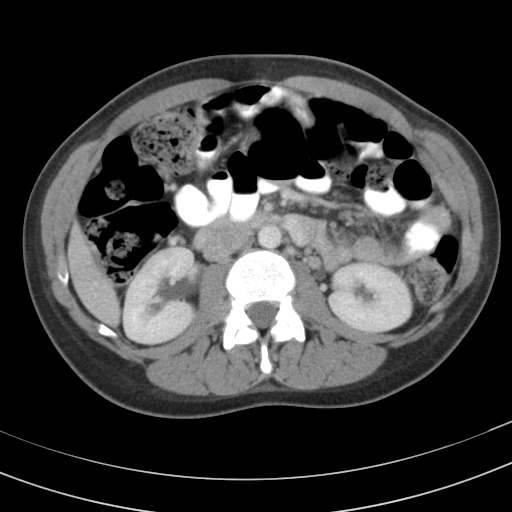
[im 64/92  soft-tissue]
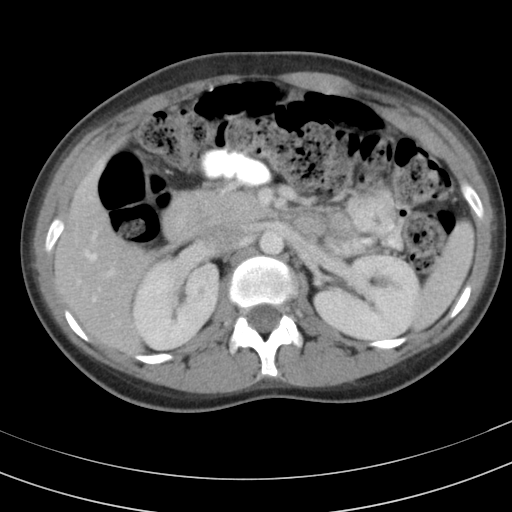
[im 64/92  bone]
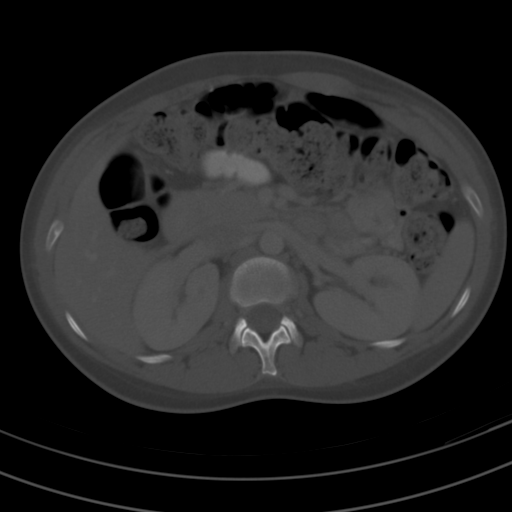
[im 71/92  soft-tissue]
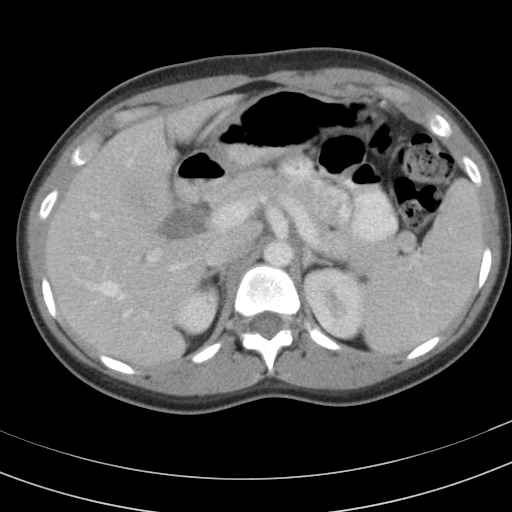
[im 78/92  soft-tissue]
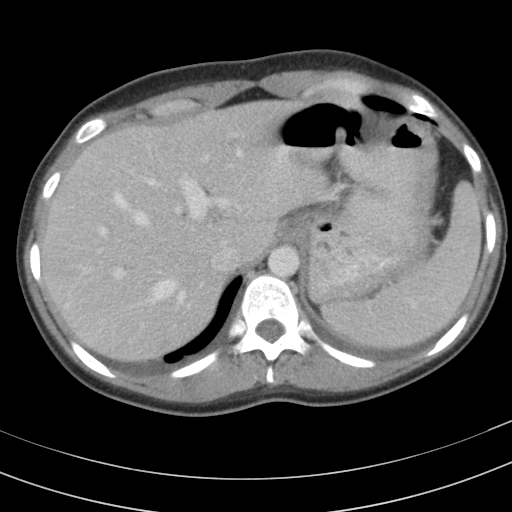
[im 78/92  lung]
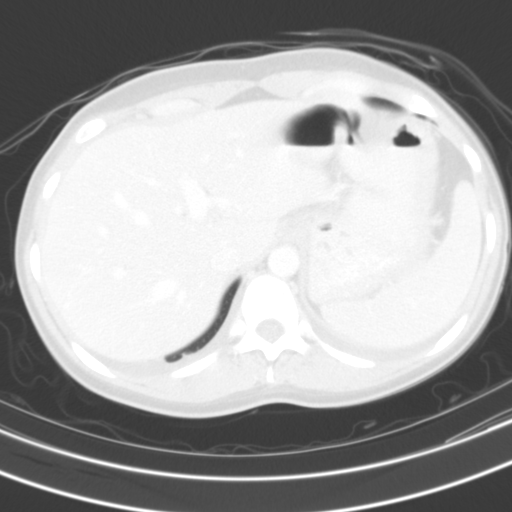
[im 81/92  lung]
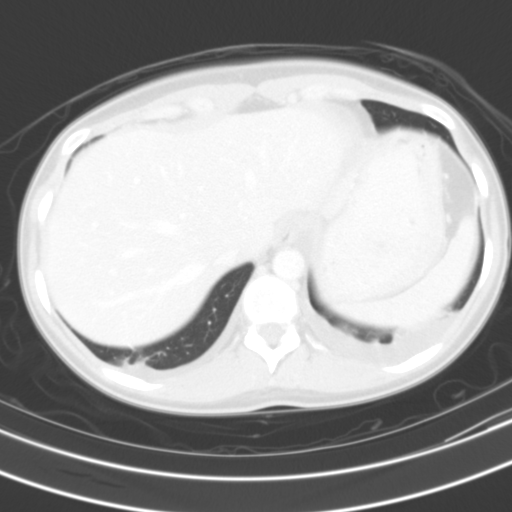
[im 85/92  soft-tissue]
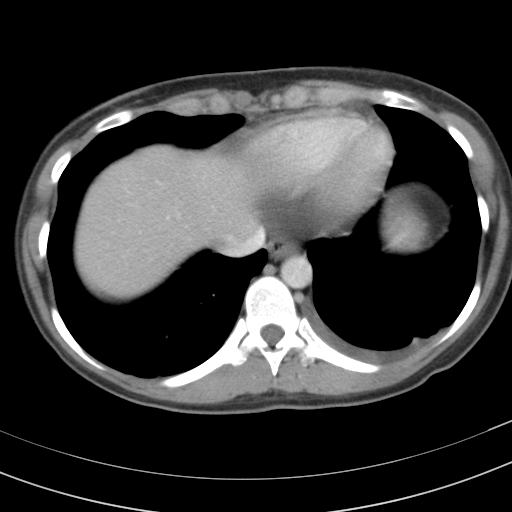
[im 85/92  lung]
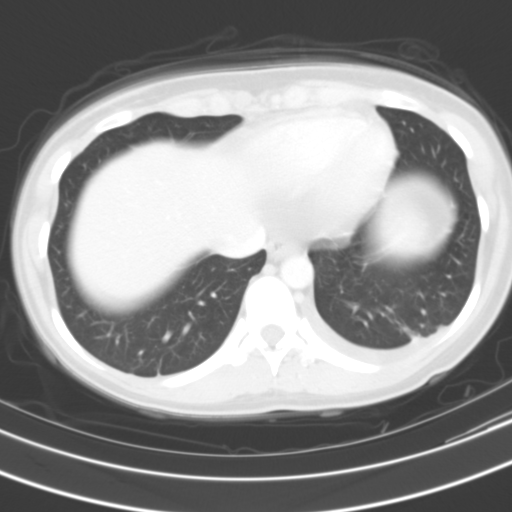
[im 88/92  lung]
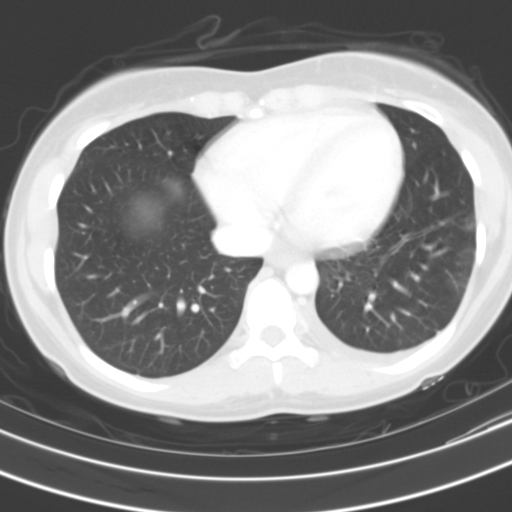

[14 of 32 positions shown; findings below may reference images not displayed]

FINDINGS: Lower chest: Small bilateral pleural effusions with basilar
atelectasis, greater on the left.

Hepatobiliary: No focal liver abnormality is seen. No gallstones,
gallbladder wall thickening, or biliary dilatation.

Pancreas: Unremarkable. No pancreatic ductal dilatation or
surrounding inflammatory changes.

Spleen: Normal in size without focal abnormality.

Adrenals/Urinary Tract: Adrenal glands are unremarkable. Kidneys are
normal, without renal calculi, focal lesion, or hydronephrosis.
Bladder is unremarkable.

Stomach/Bowel: Stomach is within normal limits. Appendix appears
normal. No evidence of bowel wall thickening, distention, or
inflammatory changes. Stool fills the colon.

Vascular/Lymphatic: No significant vascular findings are present. No
enlarged abdominal or pelvic lymph nodes.

Reproductive: Enlarged uterus consistent with recent postpartum
state. Mild irregular heterogeneous endometrial stripe thickening.
This may be due to postpartum changes. Retained products of
conception are not excluded by CT. Ovaries are not enlarged. Small
amount of free fluid in the pelvis and along the pericolic gutters
may be postoperative.

Other: Infiltration and gas in the anterior abdominal wall over the
pelvis with thickening of the anterior abdominal wall musculature is
likely postoperative. No loculated fluid collection to suggest an
abscess.

Musculoskeletal: No acute or significant osseous findings.
IMPRESSION: 1. Enlarged postpartum uterus. Nonspecific prominence of the
endometrium. Can't exclude retained products of conception on CT.
2. Mild infiltration and gas in the subcutaneous fat over the low
anterior abdominal wall likely representing postoperative change. No
loculated collection to suggest an abscess.
3. Small bilateral pleural effusions with basilar atelectasis
greater on the left.
4. Small amount of free fluid is likely postoperative.

## 2019-05-28 DIAGNOSIS — O358XX0 Maternal care for other (suspected) fetal abnormality and damage, not applicable or unspecified: Secondary | ICD-10-CM | POA: Diagnosis not present

## 2019-05-28 DIAGNOSIS — Z3A19 19 weeks gestation of pregnancy: Secondary | ICD-10-CM | POA: Diagnosis not present

## 2019-06-25 DIAGNOSIS — O358XX0 Maternal care for other (suspected) fetal abnormality and damage, not applicable or unspecified: Secondary | ICD-10-CM | POA: Diagnosis not present

## 2019-06-25 DIAGNOSIS — Z3A23 23 weeks gestation of pregnancy: Secondary | ICD-10-CM | POA: Diagnosis not present

## 2019-07-26 DIAGNOSIS — O358XX0 Maternal care for other (suspected) fetal abnormality and damage, not applicable or unspecified: Secondary | ICD-10-CM | POA: Diagnosis not present

## 2019-07-26 DIAGNOSIS — Z3A27 27 weeks gestation of pregnancy: Secondary | ICD-10-CM | POA: Diagnosis not present

## 2019-07-26 DIAGNOSIS — Z3689 Encounter for other specified antenatal screening: Secondary | ICD-10-CM | POA: Diagnosis not present

## 2019-07-26 DIAGNOSIS — Z23 Encounter for immunization: Secondary | ICD-10-CM | POA: Diagnosis not present

## 2019-07-30 DIAGNOSIS — H5203 Hypermetropia, bilateral: Secondary | ICD-10-CM | POA: Diagnosis not present

## 2019-08-08 DIAGNOSIS — Z3A29 29 weeks gestation of pregnancy: Secondary | ICD-10-CM | POA: Diagnosis not present

## 2019-08-08 DIAGNOSIS — Z3689 Encounter for other specified antenatal screening: Secondary | ICD-10-CM | POA: Diagnosis not present

## 2019-08-08 DIAGNOSIS — O358XX0 Maternal care for other (suspected) fetal abnormality and damage, not applicable or unspecified: Secondary | ICD-10-CM | POA: Diagnosis not present

## 2019-08-29 DIAGNOSIS — Z23 Encounter for immunization: Secondary | ICD-10-CM | POA: Diagnosis not present

## 2019-08-29 DIAGNOSIS — N9089 Other specified noninflammatory disorders of vulva and perineum: Secondary | ICD-10-CM | POA: Diagnosis not present

## 2019-09-05 DIAGNOSIS — O358XX0 Maternal care for other (suspected) fetal abnormality and damage, not applicable or unspecified: Secondary | ICD-10-CM | POA: Diagnosis not present

## 2019-09-05 DIAGNOSIS — Z3A33 33 weeks gestation of pregnancy: Secondary | ICD-10-CM | POA: Diagnosis not present

## 2019-09-19 DIAGNOSIS — Z3A35 35 weeks gestation of pregnancy: Secondary | ICD-10-CM | POA: Diagnosis not present

## 2019-09-19 DIAGNOSIS — O365930 Maternal care for other known or suspected poor fetal growth, third trimester, not applicable or unspecified: Secondary | ICD-10-CM | POA: Diagnosis not present

## 2019-09-19 DIAGNOSIS — Z3685 Encounter for antenatal screening for Streptococcus B: Secondary | ICD-10-CM | POA: Diagnosis not present

## 2019-09-24 ENCOUNTER — Other Ambulatory Visit: Payer: Self-pay | Admitting: Obstetrics and Gynecology

## 2019-09-24 DIAGNOSIS — F4322 Adjustment disorder with anxiety: Secondary | ICD-10-CM | POA: Diagnosis not present

## 2019-09-26 DIAGNOSIS — Z3A36 36 weeks gestation of pregnancy: Secondary | ICD-10-CM | POA: Diagnosis not present

## 2019-09-26 DIAGNOSIS — O3663X0 Maternal care for excessive fetal growth, third trimester, not applicable or unspecified: Secondary | ICD-10-CM | POA: Diagnosis not present

## 2019-10-01 DIAGNOSIS — O3663X0 Maternal care for excessive fetal growth, third trimester, not applicable or unspecified: Secondary | ICD-10-CM | POA: Diagnosis not present

## 2019-10-01 DIAGNOSIS — Z3A37 37 weeks gestation of pregnancy: Secondary | ICD-10-CM | POA: Diagnosis not present

## 2019-10-01 DIAGNOSIS — F4322 Adjustment disorder with anxiety: Secondary | ICD-10-CM | POA: Diagnosis not present

## 2019-10-03 ENCOUNTER — Encounter (HOSPITAL_COMMUNITY): Payer: Self-pay | Admitting: *Deleted

## 2019-10-03 ENCOUNTER — Telehealth (HOSPITAL_COMMUNITY): Payer: Self-pay | Admitting: *Deleted

## 2019-10-03 NOTE — Telephone Encounter (Signed)
Preadmission screen  

## 2019-10-04 ENCOUNTER — Encounter (HOSPITAL_COMMUNITY): Payer: Self-pay

## 2019-10-04 NOTE — Patient Instructions (Signed)
Allison Mcdowell  10/04/2019   Your procedure is scheduled on:  10/14/2019  Arrive at 0800 at Entrance C on [address] at [facility]  and [address]. You are invited to use the FREE valet parking or use the Visitor's parking deck.  Pick up the phone at the desk and dial [phone].  Call this number if you have problems the morning of surgery: [phone]  Remember:   Do not eat food:(After Midnight) Desps de medianoche.  Do not drink clear liquids: (After Midnight) Desps de medianoche.  Take these medicines the morning of surgery with A SIP OF WATER:  none   Do not wear jewelry, make-up or nail polish.  Do not wear lotions, powders, or perfumes. Do not wear deodorant.  Do not shave 48 hours prior to surgery.  Do not bring valuables to the hospital.  [facility] is not   responsible for any belongings or valuables brought to the hospital.  Contacts, dentures or bridgework may not be worn into surgery.  Leave suitcase in the car. After surgery it may be brought to your room.  For patients admitted to the hospital, checkout time is 11:00 AM the day of              discharge.      Please read over the following fact sheets that you were given:     Preparing for Surgery

## 2019-10-08 DIAGNOSIS — Z3A38 38 weeks gestation of pregnancy: Secondary | ICD-10-CM | POA: Diagnosis not present

## 2019-10-08 DIAGNOSIS — O3663X0 Maternal care for excessive fetal growth, third trimester, not applicable or unspecified: Secondary | ICD-10-CM | POA: Diagnosis not present

## 2019-10-10 ENCOUNTER — Telehealth (HOSPITAL_COMMUNITY): Payer: Self-pay

## 2019-10-11 DIAGNOSIS — F4322 Adjustment disorder with anxiety: Secondary | ICD-10-CM | POA: Diagnosis not present

## 2019-10-12 ENCOUNTER — Other Ambulatory Visit (HOSPITAL_COMMUNITY)
Admission: RE | Admit: 2019-10-12 | Discharge: 2019-10-12 | Disposition: A | Discharge status: Home / Self Care | Payer: BC Managed Care – PPO | Source: Ambulatory Visit | Attending: Obstetrics and Gynecology | Admitting: Obstetrics and Gynecology

## 2019-10-12 ENCOUNTER — Other Ambulatory Visit: Payer: Self-pay

## 2019-10-12 DIAGNOSIS — O98519 Other viral diseases complicating pregnancy, unspecified trimester: Secondary | ICD-10-CM | POA: Insufficient documentation

## 2019-10-12 DIAGNOSIS — O34219 Maternal care for unspecified type scar from previous cesarean delivery: Secondary | ICD-10-CM | POA: Insufficient documentation

## 2019-10-12 DIAGNOSIS — Z01812 Encounter for preprocedural laboratory examination: Secondary | ICD-10-CM | POA: Insufficient documentation

## 2019-10-12 DIAGNOSIS — U071 COVID-19: Secondary | ICD-10-CM | POA: Diagnosis not present

## 2019-10-12 LAB — CBC
HCT: 39.9 % (ref 36.0–46.0)
Hemoglobin: 13.7 g/dL (ref 12.0–15.0)
MCH: 32.7 pg (ref 26.0–34.0)
MCHC: 34.3 g/dL (ref 30.0–36.0)
MCV: 95.2 fL (ref 80.0–100.0)
Platelets: 164 10*3/uL (ref 150–400)
RBC: 4.19 MIL/uL (ref 3.87–5.11)
RDW: 12.2 % (ref 11.5–15.5)
WBC: 7.7 10*3/uL (ref 4.0–10.5)
nRBC: 0 % (ref 0.0–0.2)

## 2019-10-12 LAB — TYPE AND SCREEN
ABO/RH(D): B POS
Antibody Screen: NEGATIVE

## 2019-10-12 LAB — SARS CORONAVIRUS 2 (TAT 6-24 HRS): SARS Coronavirus 2: POSITIVE — AB

## 2019-10-12 LAB — RPR: RPR Ser Ql: NONREACTIVE

## 2019-10-12 LAB — OB RESULTS CONSOLE GBS: GBS: NEGATIVE

## 2019-10-12 NOTE — MAU Note (Signed)
Asymptomatic, pt swabbed, lab here for blood work.

## 2019-10-13 LAB — ABO/RH: ABO/RH(D): B POS

## 2019-10-13 NOTE — Anesthesia Preprocedure Evaluation (Addendum)
Anesthesia Evaluation  Patient identified by MRN, date of birth, ID band Patient awake    Reviewed: Allergy & Precautions, NPO status , Patient's Chart, lab work & pertinent test results  History of Anesthesia Complications (+) PONV  Airway Mallampati: II  TM Distance: >3 FB Neck ROM: Full    Dental   Pulmonary neg pulmonary ROS,    Pulmonary exam normal        Cardiovascular negative cardio ROS Normal cardiovascular exam     Neuro/Psych PSYCHIATRIC DISORDERS Anxiety negative neurological ROS     GI/Hepatic negative GI ROS, Neg liver ROS,   Endo/Other  negative endocrine ROS  Renal/GU negative Renal ROS  negative genitourinary   Musculoskeletal negative musculoskeletal ROS (+)   Abdominal   Peds  Hematology negative hematology ROS (+)   Anesthesia Other Findings COVID+ on 10/13/19  1 prior C/S  Reproductive/Obstetrics (+) Pregnancy                            Anesthesia Physical Anesthesia Plan  ASA: III  Anesthesia Plan: Spinal   Post-op Pain Management:    Induction:   PONV Risk Score and Plan: 4 or greater and Ondansetron and Treatment may vary due to age or medical condition  Airway Management Planned: Natural Airway  Additional Equipment: None  Intra-op Plan:   Post-operative Plan:   Informed Consent: I have reviewed the patients History and Physical, chart, labs and discussed the procedure including the risks, benefits and alternatives for the proposed anesthesia with the patient or authorized representative who has indicated his/her understanding and acceptance.       Plan Discussed with:   Anesthesia Plan Comments:        Anesthesia Quick Evaluation

## 2019-10-14 ENCOUNTER — Inpatient Hospital Stay (HOSPITAL_COMMUNITY): Admission: RE | Admit: 2019-10-14 | Payer: BC Managed Care – PPO | Source: Home / Self Care

## 2019-10-14 ENCOUNTER — Other Ambulatory Visit: Payer: Self-pay

## 2019-10-14 ENCOUNTER — Encounter (HOSPITAL_COMMUNITY)
Admission: RE | Disposition: A | Discharge status: Home / Self Care | Payer: Self-pay | Source: Home / Self Care | Attending: Obstetrics and Gynecology

## 2019-10-14 ENCOUNTER — Inpatient Hospital Stay (HOSPITAL_COMMUNITY): Payer: BC Managed Care – PPO | Admitting: Anesthesiology

## 2019-10-14 ENCOUNTER — Encounter (HOSPITAL_COMMUNITY): Payer: Self-pay | Admitting: Obstetrics and Gynecology

## 2019-10-14 ENCOUNTER — Inpatient Hospital Stay (HOSPITAL_COMMUNITY)
Admission: RE | Admit: 2019-10-14 | Discharge: 2019-10-16 | DRG: 786 | Disposition: A | Discharge status: Home / Self Care | Payer: BC Managed Care – PPO | Attending: Obstetrics and Gynecology | Admitting: Obstetrics and Gynecology

## 2019-10-14 DIAGNOSIS — U071 COVID-19: Secondary | ICD-10-CM | POA: Diagnosis not present

## 2019-10-14 DIAGNOSIS — Z3A39 39 weeks gestation of pregnancy: Secondary | ICD-10-CM | POA: Diagnosis not present

## 2019-10-14 DIAGNOSIS — O34219 Maternal care for unspecified type scar from previous cesarean delivery: Secondary | ICD-10-CM | POA: Diagnosis present

## 2019-10-14 DIAGNOSIS — O34211 Maternal care for low transverse scar from previous cesarean delivery: Principal | ICD-10-CM | POA: Diagnosis present

## 2019-10-14 DIAGNOSIS — D649 Anemia, unspecified: Secondary | ICD-10-CM | POA: Diagnosis present

## 2019-10-14 DIAGNOSIS — O9852 Other viral diseases complicating childbirth: Secondary | ICD-10-CM | POA: Diagnosis not present

## 2019-10-14 DIAGNOSIS — O9902 Anemia complicating childbirth: Secondary | ICD-10-CM | POA: Diagnosis present

## 2019-10-14 DIAGNOSIS — Z98891 History of uterine scar from previous surgery: Secondary | ICD-10-CM

## 2019-10-14 SURGERY — Surgical Case
Anesthesia: Spinal

## 2019-10-14 MED ORDER — NALBUPHINE HCL 10 MG/ML IJ SOLN: 5.0000 mg | Freq: Once | INTRAMUSCULAR | Status: DC | PRN

## 2019-10-14 MED ORDER — TRANEXAMIC ACID-NACL 1000-0.7 MG/100ML-% IV SOLN: 1000.0000 mg | INTRAVENOUS | Status: DC

## 2019-10-14 MED ORDER — TRIAMCINOLONE ACETONIDE 40 MG/ML IJ SUSP
INTRAMUSCULAR | Status: DC | PRN
  Administered 2019-10-14: 40 mg via INTRAMUSCULAR

## 2019-10-14 MED ORDER — SODIUM CHLORIDE 0.9 % IV SOLN: INTRAVENOUS | Status: DC | PRN

## 2019-10-14 MED ORDER — SIMETHICONE 80 MG PO CHEW
80.0000 mg | CHEWABLE_TABLET | Freq: Three times a day (TID) | ORAL | Status: DC
  Administered 2019-10-14 – 2019-10-16 (×6): 80 mg via ORAL
  Filled 2019-10-14 (×6): qty 1

## 2019-10-14 MED ORDER — ONDANSETRON HCL 4 MG/2ML IJ SOLN
INTRAMUSCULAR | Status: AC
  Filled 2019-10-14: qty 2

## 2019-10-14 MED ORDER — WITCH HAZEL-GLYCERIN EX PADS: 1.0000 "application " | MEDICATED_PAD | CUTANEOUS | Status: DC | PRN

## 2019-10-14 MED ORDER — FENTANYL CITRATE (PF) 100 MCG/2ML IJ SOLN: 25.0000 ug | INTRAMUSCULAR | Status: DC | PRN

## 2019-10-14 MED ORDER — TETANUS-DIPHTH-ACELL PERTUSSIS 5-2.5-18.5 LF-MCG/0.5 IM SUSP: 0.5000 mL | Freq: Once | INTRAMUSCULAR | Status: DC

## 2019-10-14 MED ORDER — OXYTOCIN 40 UNITS IN NORMAL SALINE INFUSION - SIMPLE MED
INTRAVENOUS | Status: DC | PRN
  Administered 2019-10-14: 40 mL via INTRAVENOUS

## 2019-10-14 MED ORDER — DIPHENHYDRAMINE HCL 50 MG/ML IJ SOLN: 12.5000 mg | INTRAMUSCULAR | Status: DC | PRN

## 2019-10-14 MED ORDER — SCOPOLAMINE 1 MG/3DAYS TD PT72
1.0000 | MEDICATED_PATCH | Freq: Once | TRANSDERMAL | Status: DC
  Administered 2019-10-14: 1.5 mg via TRANSDERMAL
  Filled 2019-10-14: qty 1

## 2019-10-14 MED ORDER — ONDANSETRON HCL 4 MG/2ML IJ SOLN
INTRAMUSCULAR | Status: DC | PRN
  Administered 2019-10-14: 4 mg via INTRAVENOUS

## 2019-10-14 MED ORDER — LACTATED RINGERS IV BOLUS
500.0000 mL | Freq: Once | INTRAVENOUS | Status: AC
  Administered 2019-10-14: 500 mL via INTRAVENOUS

## 2019-10-14 MED ORDER — OXYTOCIN 40 UNITS IN NORMAL SALINE INFUSION - SIMPLE MED: 2.5000 [IU]/h | INTRAVENOUS | Status: AC

## 2019-10-14 MED ORDER — TRANEXAMIC ACID-NACL 1000-0.7 MG/100ML-% IV SOLN
INTRAVENOUS | Status: AC
  Filled 2019-10-14: qty 100

## 2019-10-14 MED ORDER — METHYLERGONOVINE MALEATE 0.2 MG/ML IJ SOLN: 0.2000 mg | INTRAMUSCULAR | Status: DC | PRN

## 2019-10-14 MED ORDER — LACTATED RINGERS IV SOLN: INTRAVENOUS | Status: DC

## 2019-10-14 MED ORDER — OXYCODONE HCL 5 MG PO TABS: 5.0000 mg | ORAL_TABLET | Freq: Once | ORAL | Status: DC | PRN

## 2019-10-14 MED ORDER — BUPIVACAINE HCL (PF) 0.25 % IJ SOLN
INTRAMUSCULAR | Status: AC
  Filled 2019-10-14: qty 30

## 2019-10-14 MED ORDER — DIPHENHYDRAMINE HCL 25 MG PO CAPS: 25.0000 mg | ORAL_CAPSULE | Freq: Four times a day (QID) | ORAL | Status: DC | PRN

## 2019-10-14 MED ORDER — DIBUCAINE (PERIANAL) 1 % EX OINT: 1.0000 "application " | TOPICAL_OINTMENT | CUTANEOUS | Status: DC | PRN

## 2019-10-14 MED ORDER — CITALOPRAM HYDROBROMIDE 20 MG PO TABS
20.0000 mg | ORAL_TABLET | Freq: Every day | ORAL | Status: DC
  Administered 2019-10-14: 20 mg via ORAL
  Filled 2019-10-14: qty 1

## 2019-10-14 MED ORDER — BUPIVACAINE HCL (PF) 0.25 % IJ SOLN
INTRAMUSCULAR | Status: DC | PRN
  Administered 2019-10-14: 20 mL

## 2019-10-14 MED ORDER — SENNOSIDES-DOCUSATE SODIUM 8.6-50 MG PO TABS
2.0000 | ORAL_TABLET | ORAL | Status: DC
  Administered 2019-10-14 – 2019-10-15 (×2): 2 via ORAL
  Filled 2019-10-14 (×2): qty 2

## 2019-10-14 MED ORDER — COCONUT OIL OIL: 1.0000 "application " | TOPICAL_OIL | Status: DC | PRN

## 2019-10-14 MED ORDER — NALBUPHINE HCL 10 MG/ML IJ SOLN: 5.0000 mg | INTRAMUSCULAR | Status: DC | PRN

## 2019-10-14 MED ORDER — KETOROLAC TROMETHAMINE 30 MG/ML IJ SOLN: 30.0000 mg | Freq: Once | INTRAMUSCULAR | Status: DC | PRN

## 2019-10-14 MED ORDER — DIPHENHYDRAMINE HCL 25 MG PO CAPS: 25.0000 mg | ORAL_CAPSULE | ORAL | Status: DC | PRN

## 2019-10-14 MED ORDER — ZOLPIDEM TARTRATE 5 MG PO TABS: 5.0000 mg | ORAL_TABLET | Freq: Every evening | ORAL | Status: DC | PRN

## 2019-10-14 MED ORDER — ONDANSETRON HCL 4 MG/2ML IJ SOLN: 4.0000 mg | Freq: Once | INTRAMUSCULAR | Status: DC | PRN

## 2019-10-14 MED ORDER — CEFAZOLIN SODIUM-DEXTROSE 2-3 GM-%(50ML) IV SOLR
INTRAVENOUS | Status: DC | PRN
  Administered 2019-10-14: 2 g via INTRAVENOUS

## 2019-10-14 MED ORDER — TRIAMCINOLONE ACETONIDE 40 MG/ML IJ SUSP
INTRAMUSCULAR | Status: AC
  Filled 2019-10-14: qty 1

## 2019-10-14 MED ORDER — PRENATAL MULTIVITAMIN CH
1.0000 | ORAL_TABLET | Freq: Every day | ORAL | Status: DC
  Administered 2019-10-15 – 2019-10-16 (×2): 1 via ORAL
  Filled 2019-10-14 (×2): qty 1

## 2019-10-14 MED ORDER — NALOXONE HCL 0.4 MG/ML IJ SOLN: 0.4000 mg | INTRAMUSCULAR | Status: DC | PRN

## 2019-10-14 MED ORDER — EPHEDRINE 5 MG/ML INJ
INTRAVENOUS | Status: AC
  Administered 2019-10-14: 10 mg via INTRAVENOUS
  Filled 2019-10-14: qty 10

## 2019-10-14 MED ORDER — MORPHINE SULFATE (PF) 0.5 MG/ML IJ SOLN
INTRAMUSCULAR | Status: DC | PRN
  Administered 2019-10-14: .15 ug via INTRATHECAL

## 2019-10-14 MED ORDER — PHENYLEPHRINE HCL-NACL 20-0.9 MG/250ML-% IV SOLN
INTRAVENOUS | Status: AC
  Filled 2019-10-14: qty 250

## 2019-10-14 MED ORDER — METHYLERGONOVINE MALEATE 0.2 MG PO TABS: 0.2000 mg | ORAL_TABLET | ORAL | Status: DC | PRN

## 2019-10-14 MED ORDER — ACETAMINOPHEN 500 MG PO TABS
1000.0000 mg | ORAL_TABLET | Freq: Four times a day (QID) | ORAL | Status: DC | PRN
  Administered 2019-10-14 – 2019-10-16 (×5): 1000 mg via ORAL
  Filled 2019-10-14 (×5): qty 2

## 2019-10-14 MED ORDER — CEFAZOLIN SODIUM-DEXTROSE 2-4 GM/100ML-% IV SOLN: 2.0000 g | INTRAVENOUS | Status: DC

## 2019-10-14 MED ORDER — MEPERIDINE HCL 25 MG/ML IJ SOLN: 6.2500 mg | INTRAMUSCULAR | Status: DC | PRN

## 2019-10-14 MED ORDER — ONDANSETRON HCL 4 MG/2ML IJ SOLN: 4.0000 mg | Freq: Three times a day (TID) | INTRAMUSCULAR | Status: DC | PRN

## 2019-10-14 MED ORDER — SODIUM CHLORIDE 0.9% FLUSH: 3.0000 mL | INTRAVENOUS | Status: DC | PRN

## 2019-10-14 MED ORDER — PHENYLEPHRINE HCL-NACL 20-0.9 MG/250ML-% IV SOLN
INTRAVENOUS | Status: DC | PRN
  Administered 2019-10-14: 60 ug/min via INTRAVENOUS

## 2019-10-14 MED ORDER — SIMETHICONE 80 MG PO CHEW
80.0000 mg | CHEWABLE_TABLET | ORAL | Status: DC
  Administered 2019-10-14 – 2019-10-15 (×2): 80 mg via ORAL
  Filled 2019-10-14 (×2): qty 1

## 2019-10-14 MED ORDER — MORPHINE SULFATE (PF) 0.5 MG/ML IJ SOLN
INTRAMUSCULAR | Status: AC
  Filled 2019-10-14: qty 10

## 2019-10-14 MED ORDER — OXYCODONE HCL 5 MG/5ML PO SOLN: 5.0000 mg | Freq: Once | ORAL | Status: DC | PRN

## 2019-10-14 MED ORDER — TRIAMCINOLONE ACETONIDE 40 MG/ML IJ SUSP
40.0000 mg | Freq: Once | INTRAMUSCULAR | Status: DC
  Filled 2019-10-14: qty 1

## 2019-10-14 MED ORDER — BUPIVACAINE IN DEXTROSE 0.75-8.25 % IT SOLN
INTRATHECAL | Status: DC | PRN
  Administered 2019-10-14: 1.6 mL via INTRATHECAL

## 2019-10-14 MED ORDER — FENTANYL CITRATE (PF) 100 MCG/2ML IJ SOLN
INTRAMUSCULAR | Status: DC | PRN
  Administered 2019-10-14: 15 ug via INTRATHECAL

## 2019-10-14 MED ORDER — PHENYLEPHRINE 40 MCG/ML (10ML) SYRINGE FOR IV PUSH (FOR BLOOD PRESSURE SUPPORT)
PREFILLED_SYRINGE | INTRAVENOUS | Status: DC | PRN
  Administered 2019-10-14 (×2): 40 ug via INTRAVENOUS

## 2019-10-14 MED ORDER — MENTHOL 3 MG MT LOZG: 1.0000 | LOZENGE | OROMUCOSAL | Status: DC | PRN

## 2019-10-14 MED ORDER — CEFAZOLIN SODIUM-DEXTROSE 2-4 GM/100ML-% IV SOLN
INTRAVENOUS | Status: AC
  Filled 2019-10-14: qty 100

## 2019-10-14 MED ORDER — OXYTOCIN 40 UNITS IN NORMAL SALINE INFUSION - SIMPLE MED
INTRAVENOUS | Status: AC
  Filled 2019-10-14: qty 1000

## 2019-10-14 MED ORDER — IBUPROFEN 800 MG PO TABS
800.0000 mg | ORAL_TABLET | Freq: Four times a day (QID) | ORAL | Status: DC
  Administered 2019-10-15 – 2019-10-16 (×4): 800 mg via ORAL
  Filled 2019-10-14 (×4): qty 1

## 2019-10-14 MED ORDER — SIMETHICONE 80 MG PO CHEW
80.0000 mg | CHEWABLE_TABLET | ORAL | Status: DC | PRN
  Filled 2019-10-14: qty 1

## 2019-10-14 MED ORDER — TRANEXAMIC ACID-NACL 1000-0.7 MG/100ML-% IV SOLN
INTRAVENOUS | Status: DC | PRN
  Administered 2019-10-14: 1000 mg via INTRAVENOUS

## 2019-10-14 MED ORDER — FENTANYL CITRATE (PF) 100 MCG/2ML IJ SOLN
INTRAMUSCULAR | Status: AC
  Filled 2019-10-14: qty 2

## 2019-10-14 MED ORDER — EPHEDRINE 5 MG/ML INJ: 10.0000 mg | Freq: Once | INTRAVENOUS | Status: AC

## 2019-10-14 MED ORDER — OXYCODONE-ACETAMINOPHEN 5-325 MG PO TABS
1.0000 | ORAL_TABLET | ORAL | Status: DC | PRN
  Administered 2019-10-15: 1 via ORAL
  Filled 2019-10-14: qty 1

## 2019-10-14 MED ORDER — KETOROLAC TROMETHAMINE 30 MG/ML IJ SOLN
30.0000 mg | Freq: Four times a day (QID) | INTRAMUSCULAR | Status: AC
  Administered 2019-10-14 – 2019-10-15 (×4): 30 mg via INTRAVENOUS
  Filled 2019-10-14 (×4): qty 1

## 2019-10-14 MED ORDER — NALOXONE HCL 4 MG/10ML IJ SOLN
1.0000 ug/kg/h | INTRAVENOUS | Status: DC | PRN
  Filled 2019-10-14: qty 5

## 2019-10-14 SURGICAL SUPPLY — 36 items
BENZOIN TINCTURE PRP APPL 2/3 (GAUZE/BANDAGES/DRESSINGS) ×3 IMPLANT
CHLORAPREP W/TINT 26ML (MISCELLANEOUS) ×3 IMPLANT
CLAMP CORD UMBIL (MISCELLANEOUS) IMPLANT
CLOSURE WOUND 1/2 X4 (GAUZE/BANDAGES/DRESSINGS) ×1
CLOTH BEACON ORANGE TIMEOUT ST (SAFETY) ×3 IMPLANT
DRSG OPSITE POSTOP 4X10 (GAUZE/BANDAGES/DRESSINGS) ×3 IMPLANT
ELECT REM PT RETURN 9FT ADLT (ELECTROSURGICAL) ×3
ELECTRODE REM PT RTRN 9FT ADLT (ELECTROSURGICAL) ×1 IMPLANT
EXTRACTOR VACUUM M CUP 4 TUBE (SUCTIONS) IMPLANT
EXTRACTOR VACUUM M CUP 4' TUBE (SUCTIONS)
GLOVE BIO SURGEON STRL SZ7.5 (GLOVE) ×3 IMPLANT
GLOVE BIOGEL PI IND STRL 7.0 (GLOVE) ×1 IMPLANT
GLOVE BIOGEL PI INDICATOR 7.0 (GLOVE) ×2
GOWN STRL REUS W/TWL LRG LVL3 (GOWN DISPOSABLE) ×6 IMPLANT
KIT ABG SYR 3ML LUER SLIP (SYRINGE) IMPLANT
NEEDLE HYPO 22GX1.5 SAFETY (NEEDLE) ×3 IMPLANT
NEEDLE HYPO 25X5/8 SAFETYGLIDE (NEEDLE) IMPLANT
NEEDLE SPNL 20GX3.5 QUINCKE YW (NEEDLE) IMPLANT
NS IRRIG 1000ML POUR BTL (IV SOLUTION) ×3 IMPLANT
PACK C SECTION WH (CUSTOM PROCEDURE TRAY) ×3 IMPLANT
PENCIL SMOKE EVAC W/HOLSTER (ELECTROSURGICAL) ×3 IMPLANT
STRIP CLOSURE SKIN 1/2X4 (GAUZE/BANDAGES/DRESSINGS) ×2 IMPLANT
SUT MNCRL 0 VIOLET CTX 36 (SUTURE) ×2 IMPLANT
SUT MNCRL AB 3-0 PS2 27 (SUTURE) IMPLANT
SUT MON AB 2-0 CT1 27 (SUTURE) ×3 IMPLANT
SUT MON AB-0 CT1 36 (SUTURE) ×6 IMPLANT
SUT MONOCRYL 0 CTX 36 (SUTURE) ×4
SUT PLAIN 0 NONE (SUTURE) IMPLANT
SUT PLAIN 2 0 (SUTURE)
SUT PLAIN 2 0 XLH (SUTURE) IMPLANT
SUT PLAIN ABS 2-0 CT1 27XMFL (SUTURE) IMPLANT
SYR 20CC LL (SYRINGE) IMPLANT
SYR CONTROL 10ML LL (SYRINGE) ×3 IMPLANT
TOWEL OR 17X24 6PK STRL BLUE (TOWEL DISPOSABLE) ×3 IMPLANT
TRAY FOLEY W/BAG SLVR 14FR LF (SET/KITS/TRAYS/PACK) ×3 IMPLANT
WATER STERILE IRR 1000ML POUR (IV SOLUTION) ×3 IMPLANT

## 2019-10-14 NOTE — H&P (Signed)
Allison Mcdowell is a 28 y.o. female presenting for rpt csection. OB History    Gravida  2   Para  1   Term  1   Preterm      AB      Living  1     SAB      TAB      Ectopic      Multiple  0   Live Births  1          Past Medical History:  Diagnosis Date  . Anxiety   . Complication of anesthesia   . Infertility, female   . PONV (postoperative nausea and vomiting)    Past Surgical History:  Procedure Laterality Date  . BREAST SURGERY     aug  . CESAREAN SECTION N/A 07/04/2017   Procedure: CESAREAN SECTION;  Surgeon: Louretta Shorten, MD;  Location: [place];  Service: Obstetrics;  Laterality: N/A;  . RHINOPLASTY    . STRABISMUS SURGERY    . TONSILLECTOMY     Family History: family history includes Heart disease in her maternal grandfather; Hypertension in her father, paternal grandfather, and paternal grandmother; Melanoma in her mother; Stroke in her paternal grandfather; Thyroid disease in her maternal aunt, maternal grandmother, and mother. Social History:  reports that she has never smoked. She has never used smokeless tobacco. She reports that she does not drink alcohol or use drugs.     Maternal Diabetes: No Genetic Screening: Normal Maternal Ultrasounds/Referrals: Normal Fetal Ultrasounds or other Referrals:  None Maternal Substance Abuse:  No Significant Maternal Medications:  None Significant Maternal Lab Results:  Group B Strep negative Other Comments:  covid pos  Review of Systems  Constitutional: Negative.   All other systems reviewed and are negative.  Maternal Medical History:  Reason for admission: Contractions.   Contractions: Onset was more than 2 days ago.   Frequency: irregular.   Perceived severity is mild.    Fetal activity: Perceived fetal activity is normal.   Last perceived fetal movement was within the past hour.    Prenatal complications: no prenatal complications Prenatal Complications - Diabetes: none.       Blood pressure (!) 94/58, pulse 85, temperature 98 F (36.7 C), temperature source Oral, resp. rate 18, height 5\' 3"  (1.6 m), unknown if currently breastfeeding. Maternal Exam:  Uterine Assessment: Contraction strength is mild.  Contraction frequency is irregular.   Abdomen: Patient reports no abdominal tenderness. Surgical scars: low transverse.   Fetal presentation: vertex  Introitus: Normal vulva. Normal vagina.  Ferning test: not done.  Nitrazine test: not done. Amniotic fluid character: not assessed.  Pelvis: questionable for delivery.   Cervix: Cervix evaluated by digital exam.     Physical Exam  Nursing note and vitals reviewed. Constitutional: She is oriented to person, place, and time. She appears well-developed and well-nourished.  HENT:  Head: Normocephalic and atraumatic.  Cardiovascular: Normal rate and regular rhythm.  Respiratory: Effort normal and breath sounds normal.  GI: Soft. Bowel sounds are normal.  Genitourinary:    Vulva, vagina and uterus normal.   Musculoskeletal:        General: Normal range of motion.     Cervical back: Normal range of motion and neck supple.  Neurological: She is alert and oriented to person, place, and time. She has normal reflexes.  Skin: Skin is warm and dry.  Psychiatric: She has a normal mood and affect.    Prenatal labs: ABO, Rh: --/--/B POS, B POS (12/18  9449) Antibody: NEG (12/18 0848) Rubella:   RPR: NON REACTIVE (12/18 0848)  HBsAg:    HIV:    GBS:     Assessment/Plan: 39wks Prev csection for rpt. Covid pos- asymptomatic Rpt csection . Consent done. Risks of surgery discussed. Covid protocol noted.   Allison Mcdowell J 10/14/2019, 8:55 AM

## 2019-10-14 NOTE — Op Note (Signed)
Cesarean Section Procedure Note  Indications: previous uterine incision kerr x one and covid pos  Pre-operative Diagnosis: 39 week 0 day pregnancy.  Post-operative Diagnosis: same  Surgeon: Lovenia Kim   Assistants: Maida Sale, RNFA  Anesthesia: Local anesthesia 0.25.% bupivacaine, kenalog and Spinal anesthesia  ASA Class: 2  Procedure Details  The patient was seen in the Holding Room. The risks, benefits, complications, treatment options, and expected outcomes were discussed with the patient.  The patient concurred with the proposed plan, giving informed consent. The risks of anesthesia, infection, bleeding and possible injury to other organs discussed. Injury to bowel, bladder, or ureter with possible need for repair discussed. Possible need for transfusion with secondary risks of hepatitis or HIV acquisition discussed. Post operative complications to include but not limited to DVT, PE and Pneumonia noted. The site of surgery properly noted/marked. The patient was taken to Operating Room # A, identified as Leira Thier and the procedure verified as C-Section Delivery. A Time Out was held and the above information confirmed.  After induction of anesthesia, the patient was draped and prepped in the usual sterile manner. A Pfannenstiel incision was made and carried down through the subcutaneous tissue to the fascia. Fascial incision was made and extended transversely using Mayo scissors. The fascia was separated from the underlying rectus tissue superiorly and inferiorly. The peritoneum was identified and entered. Peritoneal incision was extended longitudinally. The utero-vesical peritoneal reflection was incised transversely and the bladder flap was adherent to LUS and was sharply and  bluntly freed from the lower uterine segment. A low transverse uterine incision(Kerr hysterotomy) was made. Delivered from OA presentation was a  female with Apgar scores of 8 at one minute and 9 at five  minutes. Bulb suctioning gently performed. Neonatal team in attendance.After the umbilical cord was clamped and cut cord blood was obtained for evaluation. The placenta was removed intact and appeared normal. The uterus was curetted with a dry lap pack. Good hemostasis was noted.The uterine outline, tubes and ovaries appeared normal. The uterine incision was closed with running locked sutures of 0 Monocryl x 2 layers. Hemostasis was observed. The parietal peritoneum was closed with a running 2-0 Monocryl suture. The fascia was then reapproximated with running sutures of 0 Monocryl. The skin was reapproximated with 3-0 monocryl after McCrory closure with 2-0 plain.  Instrument, sponge, and needle counts were correct prior the abdominal closure and at the conclusion of the case.   Findings: FTLM, ant placenta, thick LUS adhesions to bladder  Estimated Blood Loss:  300 mL         Drains: foley                 Specimens: placenta                 Complications:  None; patient tolerated the procedure well.         Disposition: PACU - hemodynamically stable.         Condition: stable  Attending Attestation: I performed the procedure.

## 2019-10-14 NOTE — Anesthesia Procedure Notes (Signed)
Spinal  Patient location during procedure: OR Staffing Performed: anesthesiologist  Anesthesiologist: Rochelle Nephew E, MD Preanesthetic Checklist Completed: patient identified, IV checked, risks and benefits discussed, surgical consent, monitors and equipment checked, pre-op evaluation and timeout performed Spinal Block Patient position: sitting Prep: DuraPrep and site prepped and draped Patient monitoring: continuous pulse ox, blood pressure and heart rate Approach: midline Location: L3-4 Injection technique: single-shot Needle Needle type: Pencan  Needle gauge: 24 G Needle length: 9 cm Additional Notes Functioning IV was confirmed and monitors were applied. Sterile prep and drape, including hand hygiene and sterile gloves were used. The patient was positioned and the spine was prepped. The skin was anesthetized with lidocaine.  Free flow of clear CSF was obtained prior to injecting local anesthetic into the CSF. The needle was carefully withdrawn. The patient tolerated the procedure well.      

## 2019-10-14 NOTE — Transfer of Care (Signed)
Immediate Anesthesia Transfer of Care Note  Patient: Allison Mcdowell  Procedure(s) Performed: Repeat CESAREAN SECTION (N/A )  Patient Location: PACU  Anesthesia Type:Spinal  Level of Consciousness: awake, alert  and oriented  Airway & Oxygen Therapy: Patient Spontanous Breathing  Post-op Assessment: Report given to RN and Post -op Vital signs reviewed and stable  Post vital signs: Reviewed and stable  Last Vitals:  Vitals Value Taken Time  BP 103/59 10/14/19 1330  Temp 36.7 C 10/14/19 1307  Pulse 59 10/14/19 1330  Resp 18 10/14/19 1324  SpO2 99 % 10/14/19 1329  Vitals shown include unvalidated device data.  Last Pain:  Vitals:   10/14/19 1316  TempSrc:   PainSc: 0-No pain         Complications: No apparent anesthesia complications

## 2019-10-14 NOTE — Progress Notes (Signed)
Patient seen and examined. Consent witnessed and signed. No changes noted. Update completed. BP (!) 93/57   Pulse 75   Temp (!) 97.3 F (36.3 C) (Axillary)   Resp 18   Ht 5\' 3"  (1.6 m)   SpO2 99%   BMI 26.93 kg/m   CBC    Component Value Date/Time   WBC 7.7 10/12/2019 0848   RBC 4.19 10/12/2019 0848   HGB 13.7 10/12/2019 0848   HCT 39.9 10/12/2019 0848   PLT 164 10/12/2019 0848   MCV 95.2 10/12/2019 0848   MCH 32.7 10/12/2019 0848   MCHC 34.3 10/12/2019 0848   RDW 12.2 10/12/2019 0848   LYMPHSABS 2.0 07/05/2017 1817   MONOABS 0.3 07/05/2017 1817   EOSABS 0.0 07/05/2017 1817   BASOSABS 0.0 07/05/2017 1817

## 2019-10-14 NOTE — Anesthesia Postprocedure Evaluation (Signed)
Anesthesia Post Note  Patient: Allison Mcdowell  Procedure(s) Performed: Repeat CESAREAN SECTION (N/A )     Patient location during evaluation: L&D Anesthesia Type: Spinal Level of consciousness: oriented and awake and alert Pain management: pain level controlled Vital Signs Assessment: post-procedure vital signs reviewed and stable Respiratory status: spontaneous breathing, respiratory function stable and nonlabored ventilation Cardiovascular status: blood pressure returned to baseline and stable Postop Assessment: no headache, no backache, no apparent nausea or vomiting and spinal receding Anesthetic complications: no    Last Vitals:  Vitals:   10/14/19 1415 10/14/19 1430  BP: (!) 98/55 108/63  Pulse: (!) 58 (!) 52  Resp: 18 18  Temp:    SpO2: 100% 100%    Last Pain:  Vitals:   10/14/19 1430  TempSrc:   PainSc: 0-No pain   Pain Goal:                Epidural/Spinal Function Cutaneous sensation: Tingles (10/14/19 1430), Patient able to flex knees: Yes (10/14/19 1430), Patient able to lift hips off bed: No (10/14/19 1430), Back pain beyond tenderness at insertion site: No (10/14/19 1430), Progressively worsening motor and/or sensory loss: No (10/14/19 1430), Bowel and/or bladder incontinence post epidural: No (10/14/19 1430), Name of anesthesiologist notified: ` (10/14/19 1415)  Lidia Collum

## 2019-10-15 ENCOUNTER — Encounter (HOSPITAL_COMMUNITY): Payer: Self-pay | Admitting: Obstetrics and Gynecology

## 2019-10-15 LAB — CBC
HCT: 33.2 % — ABNORMAL LOW (ref 36.0–46.0)
Hemoglobin: 11.1 g/dL — ABNORMAL LOW (ref 12.0–15.0)
MCH: 32.8 pg (ref 26.0–34.0)
MCHC: 33.4 g/dL (ref 30.0–36.0)
MCV: 98.2 fL (ref 80.0–100.0)
Platelets: 148 10*3/uL — ABNORMAL LOW (ref 150–400)
RBC: 3.38 MIL/uL — ABNORMAL LOW (ref 3.87–5.11)
RDW: 12.5 % (ref 11.5–15.5)
WBC: 9.2 10*3/uL (ref 4.0–10.5)
nRBC: 0 % (ref 0.0–0.2)

## 2019-10-15 MED ORDER — CITALOPRAM HYDROBROMIDE 20 MG PO TABS
20.0000 mg | ORAL_TABLET | Freq: Every day | ORAL | Status: DC
  Administered 2019-10-15: 20 mg via ORAL
  Filled 2019-10-15: qty 1

## 2019-10-15 NOTE — Progress Notes (Signed)
No c/o; tol po, pain controlled, normal bleeding Voiding w/o difficulty; no sob/cp; no n/v +flatus  Temp:  [97.3 F (36.3 C)-98.5 F (36.9 C)] 98.5 F (36.9 C) (12/21 0935) Pulse Rate:  [45-75] 49 (12/21 0935) Resp:  [16-18] 16 (12/21 0935) BP: (74-117)/(51-85) 93/62 (12/21 0935) SpO2:  [97 %-100 %] 99 % (12/21 0935)  Intake/Output Summary (Last 24 hours) at 10/15/2019 1030 Last data filed at 10/15/2019 0930 Gross per 24 hour  Intake 1822.45 ml  Output 2124 ml  Net -301.55 ml    A&ox3 rrr ctab Abd: soft, nt, nd; +bs; dressing c/d/i; fundus firm and 2cm below umb LE: no edema, nt bilat  CBC Latest Ref Rng & Units 10/15/2019 10/12/2019 01/05/2019  WBC 4.0 - 10.5 K/uL 9.2 7.7 3.1(L)  Hemoglobin 12.0 - 15.0 g/dL 11.1(L) 13.7 14.3  Hematocrit 36.0 - 46.0 % 33.2(L) 39.9 44.0  Platelets 150 - 400 K/uL 148(L) 164 158   A/P: pod 1 s/p rltcs 1. Doing well, contin current care; encourage good water intake 2. Mild acute anemia, plan iron pp 3. covid positive, asymptomatic; contin precautions and contin to monitor

## 2019-10-16 NOTE — Discharge Summary (Signed)
Obstetric Discharge Summary Reason for Admission: cesarean section Prenatal Procedures: none Intrapartum Procedures: cesarean: low cervical, transverse Postpartum Procedures: none Complications-Operative and Postpartum: none Hemoglobin  Date Value Ref Range Status  10/15/2019 11.1 (L) 12.0 - 15.0 g/dL Final   HCT  Date Value Ref Range Status  10/15/2019 33.2 (L) 36.0 - 46.0 % Final    Physical Exam:  General: alert, cooperative and appears stated age 28: appropriate Uterine Fundus: firm Incision: healing well, no significant drainage DVT Evaluation: No evidence of DVT seen on physical exam.  Discharge Diagnoses: Term Pregnancy-delivered  Discharge Information: Date: 10/16/2019 Activity: pelvic rest Diet: routine Medications: PNV and Ibuprofen Condition: stable Instructions: refer to practice specific booklet Discharge to: home   Newborn Data: Live born female  Birth Weight: 7 lb 6.9 oz (3370 g) APGAR: 7, 9  Newborn Delivery   Birth date/time: 10/14/2021 12:20:00 Delivery type: C-Section, Low Transverse Trial of labor: No C-section categorization: Repeat      Home with mother.  Cymone Yeske J 10/16/2019, 9:34 AM

## 2019-10-16 NOTE — Progress Notes (Signed)
POD 2 No c/o; tol po, pain controlled, normal bleeding Voiding w/o difficulty; no sob/cp; no n/v +flatus  Temp:  [97.9 F (36.6 C)-98.5 F (36.9 C)] 97.9 F (36.6 C) (12/22 0500) Pulse Rate:  [49-54] 51 (12/22 0500) Resp:  [16] 16 (12/22 0500) BP: (91-98)/(55-62) 98/55 (12/22 0500) SpO2:  [99 %] 99 % (12/21 0935)  Intake/Output Summary (Last 24 hours) at 10/16/2019 0926 Last data filed at 10/15/2019 1800 Gross per 24 hour  Intake -  Output 1100 ml  Net -1100 ml    A&ox3 rrr ctab Abd: soft, nt, nd; +bs; dressing c/d/i; fundus firm and 2cm below umb LE: no edema, nt bilat  CBC Latest Ref Rng & Units 10/15/2019 10/12/2019 01/05/2019  WBC 4.0 - 10.5 K/uL 9.2 7.7 3.1(L)  Hemoglobin 12.0 - 15.0 g/dL 11.1(L) 13.7 14.3  Hematocrit 36.0 - 46.0 % 33.2(L) 39.9 44.0  Platelets 150 - 400 K/uL 148(L) 164 158   A/P: pod 2 s/p rltcs 1. Doing well, contin current care; encourage good water intake 2. Mild acute anemia, plan iron pp 3. covid positive, asymptomatic; contin precautions and contin to monitor DC home today

## 2019-10-17 DIAGNOSIS — Z20828 Contact with and (suspected) exposure to other viral communicable diseases: Secondary | ICD-10-CM | POA: Diagnosis not present

## 2019-10-17 DIAGNOSIS — Z03818 Encounter for observation for suspected exposure to other biological agents ruled out: Secondary | ICD-10-CM | POA: Diagnosis not present

## 2019-11-26 DIAGNOSIS — Z124 Encounter for screening for malignant neoplasm of cervix: Secondary | ICD-10-CM | POA: Diagnosis not present

## 2019-11-26 DIAGNOSIS — Z1151 Encounter for screening for human papillomavirus (HPV): Secondary | ICD-10-CM | POA: Diagnosis not present

## 2019-12-04 DIAGNOSIS — Z0000 Encounter for general adult medical examination without abnormal findings: Secondary | ICD-10-CM | POA: Diagnosis not present

## 2019-12-12 DIAGNOSIS — Z20828 Contact with and (suspected) exposure to other viral communicable diseases: Secondary | ICD-10-CM | POA: Diagnosis not present

## 2019-12-21 DIAGNOSIS — D225 Melanocytic nevi of trunk: Secondary | ICD-10-CM | POA: Diagnosis not present

## 2019-12-21 DIAGNOSIS — D2261 Melanocytic nevi of right upper limb, including shoulder: Secondary | ICD-10-CM | POA: Diagnosis not present

## 2019-12-21 DIAGNOSIS — D2262 Melanocytic nevi of left upper limb, including shoulder: Secondary | ICD-10-CM | POA: Diagnosis not present

## 2019-12-21 DIAGNOSIS — L700 Acne vulgaris: Secondary | ICD-10-CM | POA: Diagnosis not present

## 2020-01-16 DIAGNOSIS — Z79899 Other long term (current) drug therapy: Secondary | ICD-10-CM | POA: Diagnosis not present

## 2020-01-16 DIAGNOSIS — L700 Acne vulgaris: Secondary | ICD-10-CM | POA: Diagnosis not present

## 2020-01-17 DIAGNOSIS — F4322 Adjustment disorder with anxiety: Secondary | ICD-10-CM | POA: Diagnosis not present

## 2020-01-31 DIAGNOSIS — F4322 Adjustment disorder with anxiety: Secondary | ICD-10-CM | POA: Diagnosis not present

## 2020-02-14 DIAGNOSIS — F4322 Adjustment disorder with anxiety: Secondary | ICD-10-CM | POA: Diagnosis not present

## 2020-02-15 DIAGNOSIS — L230 Allergic contact dermatitis due to metals: Secondary | ICD-10-CM | POA: Diagnosis not present

## 2020-02-15 DIAGNOSIS — Z79899 Other long term (current) drug therapy: Secondary | ICD-10-CM | POA: Diagnosis not present

## 2020-02-15 DIAGNOSIS — L700 Acne vulgaris: Secondary | ICD-10-CM | POA: Diagnosis not present

## 2020-02-28 DIAGNOSIS — F4322 Adjustment disorder with anxiety: Secondary | ICD-10-CM | POA: Diagnosis not present

## 2020-03-17 DIAGNOSIS — R21 Rash and other nonspecific skin eruption: Secondary | ICD-10-CM | POA: Diagnosis not present

## 2020-03-17 DIAGNOSIS — L299 Pruritus, unspecified: Secondary | ICD-10-CM | POA: Diagnosis not present

## 2020-03-19 DIAGNOSIS — K130 Diseases of lips: Secondary | ICD-10-CM | POA: Diagnosis not present

## 2020-03-19 DIAGNOSIS — Z79899 Other long term (current) drug therapy: Secondary | ICD-10-CM | POA: Diagnosis not present

## 2020-03-19 DIAGNOSIS — L235 Allergic contact dermatitis due to other chemical products: Secondary | ICD-10-CM | POA: Diagnosis not present

## 2020-03-19 DIAGNOSIS — L700 Acne vulgaris: Secondary | ICD-10-CM | POA: Diagnosis not present

## 2020-04-03 DIAGNOSIS — L309 Dermatitis, unspecified: Secondary | ICD-10-CM | POA: Diagnosis not present

## 2020-04-09 DIAGNOSIS — F4322 Adjustment disorder with anxiety: Secondary | ICD-10-CM | POA: Diagnosis not present

## 2020-04-23 DIAGNOSIS — F4322 Adjustment disorder with anxiety: Secondary | ICD-10-CM | POA: Diagnosis not present

## 2020-04-25 DIAGNOSIS — L700 Acne vulgaris: Secondary | ICD-10-CM | POA: Diagnosis not present

## 2020-04-25 DIAGNOSIS — K130 Diseases of lips: Secondary | ICD-10-CM | POA: Diagnosis not present

## 2020-04-25 DIAGNOSIS — Z79899 Other long term (current) drug therapy: Secondary | ICD-10-CM | POA: Diagnosis not present

## 2020-05-06 DIAGNOSIS — D229 Melanocytic nevi, unspecified: Secondary | ICD-10-CM | POA: Diagnosis not present

## 2020-05-06 DIAGNOSIS — L309 Dermatitis, unspecified: Secondary | ICD-10-CM | POA: Diagnosis not present

## 2020-05-06 DIAGNOSIS — L249 Irritant contact dermatitis, unspecified cause: Secondary | ICD-10-CM | POA: Diagnosis not present

## 2020-05-06 DIAGNOSIS — L739 Follicular disorder, unspecified: Secondary | ICD-10-CM | POA: Diagnosis not present

## 2020-05-07 DIAGNOSIS — Z79899 Other long term (current) drug therapy: Secondary | ICD-10-CM | POA: Diagnosis not present

## 2020-05-07 DIAGNOSIS — F4322 Adjustment disorder with anxiety: Secondary | ICD-10-CM | POA: Diagnosis not present

## 2020-05-21 DIAGNOSIS — F4322 Adjustment disorder with anxiety: Secondary | ICD-10-CM | POA: Diagnosis not present

## 2020-06-04 DIAGNOSIS — B3783 Candidal cheilitis: Secondary | ICD-10-CM | POA: Diagnosis not present

## 2020-06-04 DIAGNOSIS — L309 Dermatitis, unspecified: Secondary | ICD-10-CM | POA: Diagnosis not present

## 2020-06-09 DIAGNOSIS — L309 Dermatitis, unspecified: Secondary | ICD-10-CM | POA: Diagnosis not present

## 2020-06-12 DIAGNOSIS — K130 Diseases of lips: Secondary | ICD-10-CM | POA: Diagnosis not present

## 2020-06-12 DIAGNOSIS — L700 Acne vulgaris: Secondary | ICD-10-CM | POA: Diagnosis not present

## 2020-06-12 DIAGNOSIS — Z79899 Other long term (current) drug therapy: Secondary | ICD-10-CM | POA: Diagnosis not present

## 2020-07-15 DIAGNOSIS — Z79899 Other long term (current) drug therapy: Secondary | ICD-10-CM | POA: Diagnosis not present

## 2020-07-15 DIAGNOSIS — K130 Diseases of lips: Secondary | ICD-10-CM | POA: Diagnosis not present

## 2020-07-15 DIAGNOSIS — L700 Acne vulgaris: Secondary | ICD-10-CM | POA: Diagnosis not present

## 2020-07-23 DIAGNOSIS — Z79899 Other long term (current) drug therapy: Secondary | ICD-10-CM | POA: Diagnosis not present

## 2020-08-20 DIAGNOSIS — Z20822 Contact with and (suspected) exposure to covid-19: Secondary | ICD-10-CM | POA: Diagnosis not present

## 2020-08-20 DIAGNOSIS — Z79899 Other long term (current) drug therapy: Secondary | ICD-10-CM | POA: Diagnosis not present

## 2020-08-20 DIAGNOSIS — L700 Acne vulgaris: Secondary | ICD-10-CM | POA: Diagnosis not present

## 2020-08-20 DIAGNOSIS — K130 Diseases of lips: Secondary | ICD-10-CM | POA: Diagnosis not present

## 2020-09-16 DIAGNOSIS — J22 Unspecified acute lower respiratory infection: Secondary | ICD-10-CM | POA: Diagnosis not present

## 2020-09-24 DIAGNOSIS — L700 Acne vulgaris: Secondary | ICD-10-CM | POA: Diagnosis not present

## 2020-09-24 DIAGNOSIS — F4322 Adjustment disorder with anxiety: Secondary | ICD-10-CM | POA: Diagnosis not present

## 2020-09-24 DIAGNOSIS — Z79899 Other long term (current) drug therapy: Secondary | ICD-10-CM | POA: Diagnosis not present

## 2020-09-30 DIAGNOSIS — H5203 Hypermetropia, bilateral: Secondary | ICD-10-CM | POA: Diagnosis not present

## 2020-10-07 DIAGNOSIS — F4322 Adjustment disorder with anxiety: Secondary | ICD-10-CM | POA: Diagnosis not present

## 2020-10-27 DIAGNOSIS — Z79899 Other long term (current) drug therapy: Secondary | ICD-10-CM | POA: Diagnosis not present

## 2020-12-31 DIAGNOSIS — E611 Iron deficiency: Secondary | ICD-10-CM | POA: Diagnosis not present

## 2020-12-31 DIAGNOSIS — Z0000 Encounter for general adult medical examination without abnormal findings: Secondary | ICD-10-CM | POA: Diagnosis not present

## 2020-12-31 DIAGNOSIS — E538 Deficiency of other specified B group vitamins: Secondary | ICD-10-CM | POA: Diagnosis not present

## 2020-12-31 DIAGNOSIS — R5383 Other fatigue: Secondary | ICD-10-CM | POA: Diagnosis not present

## 2020-12-31 DIAGNOSIS — M1990 Unspecified osteoarthritis, unspecified site: Secondary | ICD-10-CM | POA: Diagnosis not present

## 2021-05-28 DIAGNOSIS — F4322 Adjustment disorder with anxiety: Secondary | ICD-10-CM | POA: Diagnosis not present

## 2021-06-11 DIAGNOSIS — Z6823 Body mass index (BMI) 23.0-23.9, adult: Secondary | ICD-10-CM | POA: Diagnosis not present

## 2021-06-11 DIAGNOSIS — Z113 Encounter for screening for infections with a predominantly sexual mode of transmission: Secondary | ICD-10-CM | POA: Diagnosis not present

## 2021-06-11 DIAGNOSIS — Z3041 Encounter for surveillance of contraceptive pills: Secondary | ICD-10-CM | POA: Diagnosis not present

## 2021-06-11 DIAGNOSIS — Z124 Encounter for screening for malignant neoplasm of cervix: Secondary | ICD-10-CM | POA: Diagnosis not present

## 2021-06-11 DIAGNOSIS — Z01419 Encounter for gynecological examination (general) (routine) without abnormal findings: Secondary | ICD-10-CM | POA: Diagnosis not present

## 2021-06-17 DIAGNOSIS — D229 Melanocytic nevi, unspecified: Secondary | ICD-10-CM | POA: Diagnosis not present

## 2021-06-17 DIAGNOSIS — L859 Epidermal thickening, unspecified: Secondary | ICD-10-CM | POA: Diagnosis not present

## 2021-06-17 DIAGNOSIS — L918 Other hypertrophic disorders of the skin: Secondary | ICD-10-CM | POA: Diagnosis not present

## 2021-06-17 DIAGNOSIS — L309 Dermatitis, unspecified: Secondary | ICD-10-CM | POA: Diagnosis not present

## 2021-06-18 DIAGNOSIS — F4322 Adjustment disorder with anxiety: Secondary | ICD-10-CM | POA: Diagnosis not present

## 2021-09-22 DIAGNOSIS — F429 Obsessive-compulsive disorder, unspecified: Secondary | ICD-10-CM | POA: Diagnosis not present

## 2021-10-05 DIAGNOSIS — H5203 Hypermetropia, bilateral: Secondary | ICD-10-CM | POA: Diagnosis not present

## 2021-10-06 DIAGNOSIS — J069 Acute upper respiratory infection, unspecified: Secondary | ICD-10-CM | POA: Diagnosis not present

## 2021-10-06 DIAGNOSIS — R051 Acute cough: Secondary | ICD-10-CM | POA: Diagnosis not present

## 2021-10-06 DIAGNOSIS — Z20822 Contact with and (suspected) exposure to covid-19: Secondary | ICD-10-CM | POA: Diagnosis not present

## 2021-10-06 DIAGNOSIS — F4322 Adjustment disorder with anxiety: Secondary | ICD-10-CM | POA: Diagnosis not present

## 2021-10-06 DIAGNOSIS — J040 Acute laryngitis: Secondary | ICD-10-CM | POA: Diagnosis not present

## 2021-10-29 DIAGNOSIS — F411 Generalized anxiety disorder: Secondary | ICD-10-CM | POA: Diagnosis not present

## 2021-10-29 DIAGNOSIS — R002 Palpitations: Secondary | ICD-10-CM | POA: Diagnosis not present

## 2021-10-29 DIAGNOSIS — M2540 Effusion, unspecified joint: Secondary | ICD-10-CM | POA: Diagnosis not present

## 2021-10-29 DIAGNOSIS — K130 Diseases of lips: Secondary | ICD-10-CM | POA: Diagnosis not present

## 2021-10-29 DIAGNOSIS — F429 Obsessive-compulsive disorder, unspecified: Secondary | ICD-10-CM | POA: Diagnosis not present

## 2021-10-29 DIAGNOSIS — R5383 Other fatigue: Secondary | ICD-10-CM | POA: Diagnosis not present

## 2021-10-29 DIAGNOSIS — Z0000 Encounter for general adult medical examination without abnormal findings: Secondary | ICD-10-CM | POA: Diagnosis not present

## 2021-11-03 DIAGNOSIS — F4322 Adjustment disorder with anxiety: Secondary | ICD-10-CM | POA: Diagnosis not present

## 2021-11-09 DIAGNOSIS — Z8249 Family history of ischemic heart disease and other diseases of the circulatory system: Secondary | ICD-10-CM | POA: Diagnosis not present

## 2021-11-09 DIAGNOSIS — R002 Palpitations: Secondary | ICD-10-CM | POA: Diagnosis not present

## 2021-11-17 DIAGNOSIS — F4322 Adjustment disorder with anxiety: Secondary | ICD-10-CM | POA: Diagnosis not present

## 2021-11-17 DIAGNOSIS — L309 Dermatitis, unspecified: Secondary | ICD-10-CM | POA: Diagnosis not present

## 2021-11-18 DIAGNOSIS — R002 Palpitations: Secondary | ICD-10-CM | POA: Diagnosis not present

## 2021-11-18 DIAGNOSIS — Z8249 Family history of ischemic heart disease and other diseases of the circulatory system: Secondary | ICD-10-CM | POA: Diagnosis not present

## 2021-12-08 DIAGNOSIS — F4322 Adjustment disorder with anxiety: Secondary | ICD-10-CM | POA: Diagnosis not present

## 2021-12-10 DIAGNOSIS — F411 Generalized anxiety disorder: Secondary | ICD-10-CM | POA: Diagnosis not present

## 2021-12-10 DIAGNOSIS — F429 Obsessive-compulsive disorder, unspecified: Secondary | ICD-10-CM | POA: Diagnosis not present

## 2021-12-10 DIAGNOSIS — R002 Palpitations: Secondary | ICD-10-CM | POA: Diagnosis not present

## 2022-01-01 DIAGNOSIS — Z8249 Family history of ischemic heart disease and other diseases of the circulatory system: Secondary | ICD-10-CM | POA: Diagnosis not present

## 2022-01-01 DIAGNOSIS — R002 Palpitations: Secondary | ICD-10-CM | POA: Diagnosis not present

## 2022-01-20 DIAGNOSIS — K130 Diseases of lips: Secondary | ICD-10-CM | POA: Diagnosis not present

## 2022-01-20 DIAGNOSIS — L309 Dermatitis, unspecified: Secondary | ICD-10-CM | POA: Diagnosis not present

## 2022-01-25 DIAGNOSIS — H1031 Unspecified acute conjunctivitis, right eye: Secondary | ICD-10-CM | POA: Diagnosis not present

## 2022-01-26 DIAGNOSIS — H16101 Unspecified superficial keratitis, right eye: Secondary | ICD-10-CM | POA: Diagnosis not present

## 2022-01-28 DIAGNOSIS — H16101 Unspecified superficial keratitis, right eye: Secondary | ICD-10-CM | POA: Diagnosis not present

## 2022-02-12 DIAGNOSIS — Z8249 Family history of ischemic heart disease and other diseases of the circulatory system: Secondary | ICD-10-CM | POA: Diagnosis not present

## 2022-02-12 DIAGNOSIS — R002 Palpitations: Secondary | ICD-10-CM | POA: Diagnosis not present

## 2022-06-16 DIAGNOSIS — Z0142 Encounter for cervical smear to confirm findings of recent normal smear following initial abnormal smear: Secondary | ICD-10-CM | POA: Diagnosis not present

## 2022-06-16 DIAGNOSIS — Z6823 Body mass index (BMI) 23.0-23.9, adult: Secondary | ICD-10-CM | POA: Diagnosis not present

## 2022-06-16 DIAGNOSIS — Z01419 Encounter for gynecological examination (general) (routine) without abnormal findings: Secondary | ICD-10-CM | POA: Diagnosis not present

## 2022-07-20 DIAGNOSIS — H1031 Unspecified acute conjunctivitis, right eye: Secondary | ICD-10-CM | POA: Diagnosis not present

## 2022-10-04 DIAGNOSIS — D239 Other benign neoplasm of skin, unspecified: Secondary | ICD-10-CM | POA: Diagnosis not present

## 2022-10-04 DIAGNOSIS — L570 Actinic keratosis: Secondary | ICD-10-CM | POA: Diagnosis not present

## 2022-10-04 DIAGNOSIS — D229 Melanocytic nevi, unspecified: Secondary | ICD-10-CM | POA: Diagnosis not present

## 2022-10-04 DIAGNOSIS — L309 Dermatitis, unspecified: Secondary | ICD-10-CM | POA: Diagnosis not present

## 2022-10-04 DIAGNOSIS — L905 Scar conditions and fibrosis of skin: Secondary | ICD-10-CM | POA: Diagnosis not present
